# Patient Record
Sex: Female | Born: 1995 | Race: Black or African American | Hispanic: No | Marital: Single | Smoking: Former smoker
Health system: Southern US, Community
[De-identification: ages and names within clinical notes are randomized; demographics above are authoritative.]

## PROBLEM LIST (undated history)

## (undated) DIAGNOSIS — S060X9A Concussion with loss of consciousness of unspecified duration, initial encounter: Secondary | ICD-10-CM

## (undated) DIAGNOSIS — R55 Syncope and collapse: Secondary | ICD-10-CM

## (undated) DIAGNOSIS — J45909 Unspecified asthma, uncomplicated: Secondary | ICD-10-CM

## (undated) DIAGNOSIS — E079 Disorder of thyroid, unspecified: Secondary | ICD-10-CM

## (undated) DIAGNOSIS — S060XAA Concussion with loss of consciousness status unknown, initial encounter: Secondary | ICD-10-CM

## (undated) DIAGNOSIS — R51 Headache: Secondary | ICD-10-CM

## (undated) DIAGNOSIS — R519 Headache, unspecified: Secondary | ICD-10-CM

## (undated) DIAGNOSIS — G43909 Migraine, unspecified, not intractable, without status migrainosus: Secondary | ICD-10-CM

## (undated) HISTORY — DX: Syncope and collapse: R55

## (undated) HISTORY — DX: Concussion with loss of consciousness status unknown, initial encounter: S06.0XAA

## (undated) HISTORY — DX: Unspecified asthma, uncomplicated: J45.909

## (undated) HISTORY — DX: Migraine, unspecified, not intractable, without status migrainosus: G43.909

## (undated) HISTORY — DX: Headache: R51

## (undated) HISTORY — DX: Headache, unspecified: R51.9

## (undated) HISTORY — DX: Concussion with loss of consciousness of unspecified duration, initial encounter: S06.0X9A

---

## 2018-03-22 ENCOUNTER — Other Ambulatory Visit: Payer: Self-pay | Admitting: Surgery

## 2018-03-29 ENCOUNTER — Telehealth: Payer: Self-pay | Admitting: Hematology

## 2018-03-29 NOTE — Telephone Encounter (Signed)
Pt has been scheduled to see Dr. Candise Che on 5/6 at 11am. Pt aware to arrive 30 minutes early. Location given.

## 2018-03-30 ENCOUNTER — Telehealth: Payer: Self-pay | Admitting: Hematology

## 2018-03-30 NOTE — Telephone Encounter (Signed)
Patient called to reschedule and Md said its was ok to add for 5/10

## 2018-04-04 ENCOUNTER — Encounter: Payer: Self-pay | Admitting: Hematology

## 2018-04-07 NOTE — Progress Notes (Signed)
HEMATOLOGY/ONCOLOGY CONSULTATION NOTE  Date of Service: 04/08/2018  Patient Care Team: Patient, No Pcp Per as PCP - General (General Practice)  CHIEF COMPLAINTS/PURPOSE OF CONSULTATION:  Possible Von Willebrand Disease  HISTORY OF PRESENTING ILLNESS:    Rachel Farrell is a wonderful 22 y.o. female who has been referred to [place] by her surgeon Dr. Magnus Ivan for evaluation and management of possible inheritance of Von Willebrand Disease.   She has had abdominal pain on and off for 3 years. She was seen GI Dr. Loreta Ave who did an endoscopy and then [place]. She had another [place] which clearly indicated she had a small gallstone which was thought to be the cause of her symptoms. The pain has been wrenching or sharp pain. She notes when she eats fatty foods she gets hot flashes. She is to proceed with getting surgery to resolve this. She does take Prilosec but this has only helped her some. She notes she has loss weight given low appetite from pain.   She was being seen by Dr. Magnus Ivan for a scheduled for laparoscopic cholecystectomy, before her insurance is to be canceled, when the possibility of her having Von Willebrand Disease came up. Dr. Magnus Ivan referred her given she bleeds easily and her mother has Von Willebrand Disease. He would like to determine her clearance for surgery. She notes her insurance does not end until July for her.   She notes she has been diagnosed with anxiety and functional dyspepsia which she takes imipramine for. She also has significant acne that she is on low estrogen birth control for. This has helped her acne. She has been on birth control 21 day on, 1 week off pills for 2 years. She is currently on the last week of hormone pills.   She notes when she gets a cut she would bleed for longer times. Before birth control she had significant Menorrhagia and was anemic for 2 years. Her periods would be irregular. She is not sure how low her iron levels were. She did have to take oral  iron during that time.   She notes to having gum bleeding when brushing too hard. She notes she had all 4 wisdom teeth extracted 2 years ago. She does not remember excess bleeding at that time. She notes to having gum bleeding with flossing at the dentist. She denies history of epistaxis.  She notes she uses naproxen for her migraines. She is allergic to Penicillin. She is on cetirizine for her allergies.  She notes her thyroid levels were tested previously and were slightly off. She was told to get her levels checked more often.  According to the patient her mother is known to have VWD, she is not sure what type nor what treatment she had taken. She denies knowing other family members with bleeding issues. She notes maternal family has HTN and heart issues.     MEDICAL HISTORY:  Anxiety Possible Fhx of Von willebrand disease Functional Dyspepsia  SURGICAL HISTORY: -wisdom teeth extraction  SOCIAL HISTORY: Social History   Socioeconomic History  . Marital status: Single    Spouse name: Not on file  . Number of children: Not on file  . Years of education: Not on file  . Highest education level: Not on file  Occupational History  . Not on file  Social Needs  . Financial resource strain: Not on file  . Food insecurity:    Worry: Not on file    Inability: Not on file  . Transportation needs:  Medical: Not on file    Non-medical: Not on file  Tobacco Use  . Smoking status: Never Smoker  . Smokeless tobacco: Never Used  Substance and Sexual Activity  . Alcohol use: Not on file  . Drug use: Never  . Sexual activity: Not on file  Lifestyle  . Physical activity:    Days per week: Not on file    Minutes per session: Not on file  . Stress: Not on file  Relationships  . Social connections:    Talks on phone: Not on file    Gets together: Not on file    Attends religious service: Not on file    Active member of club or organization: Not on file    Attends meetings of clubs  or organizations: Not on file    Relationship status: Not on file  . Intimate partner violence:    Fear of current or ex partner: Not on file    Emotionally abused: Not on file    Physically abused: Not on file    Forced sexual activity: Not on file  Other Topics Concern  . Not on file  Social History Narrative  . Not on file    FAMILY HISTORY: Possible Fhx of Von Willebrand disease in her mother ALLERGIES:  is allergic to latex.  MEDICATIONS:  Current Outpatient Medications  Medication Sig Dispense Refill  . cetirizine (ZYRTEC) 10 MG tablet Take by mouth.    Marland Kitchen imipramine (TOFRANIL) 25 MG tablet Take by mouth.    . Norethindrone-Ethinyl Estradiol-Fe Biphas (LO LOESTRIN FE) 1 MG-10 MCG / 10 MCG tablet TAKE 1 TABLET, ORAL, DAILY    . omeprazole (PRILOSEC) 40 MG capsule   0   No current facility-administered medications for this visit.     REVIEW OF SYSTEMS:   10 Point review of Systems was done is negative except as noted above.  PHYSICAL EXAMINATION: ECOG PERFORMANCE STATUS: 0 - Asymptomatic  . Vitals:   04/08/18 1017  BP: 115/89  Pulse: 79  Resp: 17  Temp: 98.4 F (36.9 C)  SpO2: 100%   Filed Weights   04/08/18 1017  Weight: 134 lb 11.2 oz (61.1 kg)   .Body mass index is 23.12 kg/m.  GENERAL:alert, in no acute distress and comfortable SKIN: no acute rashes, no significant lesions EYES: conjunctiva are pink and non-injected, sclera anicteric OROPHARYNX: MMM, no exudates, no oropharyngeal erythema or ulceration NECK: supple, no JVD LYMPH:  no palpable lymphadenopathy in the cervical, axillary or inguinal regions LUNGS: clear to auscultation b/l with normal respiratory effort HEART: regular rate & rhythm ABDOMEN:  normoactive bowel sounds , non tender, not distended. Extremity: no pedal edema PSYCH: alert & oriented x 3 with fluent speech NEURO: no focal motor/sensory deficits  LABORATORY DATA:  I have reviewed the data as listed  .No flowsheet data  found.  .No flowsheet data found.   RADIOGRAPHIC STUDIES: I have personally reviewed the radiological images as listed and agreed with the findings in the report. No results found.  ASSESSMENT & PLAN:  Rachel Farrell is a 22 y.o. African-American female with   1. Possible Von Willebrand Disease  -Mother positive for VWD per patients report -history of Menorrhagia and intermittent gum bleeding  2. Gall Stones - symptomatic -She plans to have laparoscopic cholecystectomy as soon as possible before current insurance is canceled -Managed by Dr. Magnus Ivan   PLAN:  -I discussed what Von Willebrand Disease is and that it is likely to be present in each generation.  -  I explained what will happen with Von Willebrand Disease  when bleeding occurs. This can present differently based on the type of VWD and how severe it is.   -She would need to test for Von Willebrand Disease and differentiate which subtype she may have.  -I explained Von Willebrand factor levels are increased estrogen levels/replacement. Given she is on birth control her levels may be elevated upon testing. She is currently on the last week of hormone pills.  -I recommend getting tested the week before starting next cycle of pills if possible, as her levels will be closer to baseline.  -Given she lives 2 hours away, I will test today for clearance of surgery and I will discuss results with pt next week when results are in.  -Based on her levels she may or may not need treatment. If levels are low she could get treated with DDAVP or might need factor replacement.  -I recommend she do surgery in the hospital in case of concerns for bleeding and to be able to manage and potential bleeding issues.  -I strongly advised her to not take Naproxen or aspirin like medication for now. (we shall place NSAIDS in her medication allergies)   Labs today RTC with Dr Candise Che on 04/15/2018   All of the patients questions were answered with apparent  satisfaction. The patient knows to call the clinic with any problems, questions or concerns.  I spent 45 minutes counseling the patient face to face. The total time spent in the appointment was 60 minutes and more than 50% was on counseling and direct patient cares.    Wyvonnia Lora MD MS AAHIVMS [facility] Hematology/Oncology Physician [facility]  (Office):       [phone] (Work cell):  [phone] (Fax):           [phone]  04/08/2018 11:17 AM  This document serves as a record of services personally performed by Wyvonnia Lora, MD. It was created on his behalf by Delphina Cahill, a trained medical scribe. The creation of this record is based on the scribe's personal observations and the provider's statements to them.    .I have reviewed the above documentation for accuracy and completeness, and I agree with the above. Johney Maine MD MS

## 2018-04-08 ENCOUNTER — Telehealth: Payer: Self-pay

## 2018-04-08 ENCOUNTER — Inpatient Hospital Stay: Payer: BLUE CROSS/BLUE SHIELD | Attending: Hematology | Admitting: Hematology

## 2018-04-08 ENCOUNTER — Inpatient Hospital Stay: Payer: BLUE CROSS/BLUE SHIELD

## 2018-04-08 ENCOUNTER — Encounter: Payer: Self-pay | Admitting: Hematology

## 2018-04-08 VITALS — BP 115/89 | HR 79 | Temp 98.4°F | Resp 17 | Ht 64.0 in | Wt 134.7 lb

## 2018-04-08 DIAGNOSIS — R1013 Epigastric pain: Secondary | ICD-10-CM | POA: Diagnosis not present

## 2018-04-08 DIAGNOSIS — R109 Unspecified abdominal pain: Secondary | ICD-10-CM | POA: Diagnosis not present

## 2018-04-08 DIAGNOSIS — K8080 Other cholelithiasis without obstruction: Secondary | ICD-10-CM | POA: Diagnosis not present

## 2018-04-08 DIAGNOSIS — Z79899 Other long term (current) drug therapy: Secondary | ICD-10-CM | POA: Diagnosis not present

## 2018-04-08 DIAGNOSIS — D699 Hemorrhagic condition, unspecified: Secondary | ICD-10-CM

## 2018-04-08 DIAGNOSIS — Z01818 Encounter for other preprocedural examination: Secondary | ICD-10-CM

## 2018-04-08 DIAGNOSIS — F419 Anxiety disorder, unspecified: Secondary | ICD-10-CM | POA: Diagnosis not present

## 2018-04-08 DIAGNOSIS — E079 Disorder of thyroid, unspecified: Secondary | ICD-10-CM | POA: Insufficient documentation

## 2018-04-08 DIAGNOSIS — D6800 Von Willebrand disease, unspecified: Secondary | ICD-10-CM

## 2018-04-08 LAB — CBC WITH DIFFERENTIAL/PLATELET
BASOS ABS: 0 10*3/uL (ref 0.0–0.1)
BASOS PCT: 0 %
EOS PCT: 1 %
Eosinophils Absolute: 0 10*3/uL (ref 0.0–0.5)
HCT: 42.2 % (ref 34.8–46.6)
Hemoglobin: 14 g/dL (ref 11.6–15.9)
LYMPHS PCT: 32 %
Lymphs Abs: 1.3 10*3/uL (ref 0.9–3.3)
MCH: 29.6 pg (ref 25.1–34.0)
MCHC: 33.2 g/dL (ref 31.5–36.0)
MCV: 89.2 fL (ref 79.5–101.0)
MONO ABS: 0.3 10*3/uL (ref 0.1–0.9)
Monocytes Relative: 7 %
NEUTROS ABS: 2.4 10*3/uL (ref 1.5–6.5)
Neutrophils Relative %: 60 %
Platelets: 305 10*3/uL (ref 145–400)
RBC: 4.73 MIL/uL (ref 3.70–5.45)
RDW: 12.5 % (ref 11.2–14.5)
WBC: 4 10*3/uL (ref 3.9–10.3)

## 2018-04-08 LAB — CMP (CANCER CENTER ONLY)
ALBUMIN: 4.3 g/dL (ref 3.5–5.0)
ALK PHOS: 69 U/L (ref 40–150)
ALT: 16 U/L (ref 0–55)
AST: 18 U/L (ref 5–34)
Anion gap: 7 (ref 3–11)
BILIRUBIN TOTAL: 0.5 mg/dL (ref 0.2–1.2)
BUN: 7 mg/dL (ref 7–26)
CALCIUM: 9.8 mg/dL (ref 8.4–10.4)
CO2: 23 mmol/L (ref 22–29)
Chloride: 107 mmol/L (ref 98–109)
Creatinine: 0.95 mg/dL (ref 0.60–1.10)
GFR, Est AFR Am: >60 mL/min (ref >60)
GLUCOSE: 81 mg/dL (ref 70–140)
Potassium: 4.2 mmol/L (ref 3.5–5.1)
Sodium: 137 mmol/L (ref 136–145)
TOTAL PROTEIN: 8.2 g/dL (ref 6.4–8.3)

## 2018-04-08 LAB — PLATELET FUNCTION ASSAY: Collagen / Epinephrine: 142 seconds (ref 0–193)

## 2018-04-08 LAB — PROTIME-INR
INR: 1
PROTHROMBIN TIME: 13.1 s (ref 11.4–15.2)

## 2018-04-08 LAB — APTT: APTT: 29 s (ref 24–36)

## 2018-04-08 NOTE — Telephone Encounter (Signed)
Printed avs and calender of upcoming appointment. Per 5/10 los 

## 2018-04-08 NOTE — Patient Instructions (Signed)
Thank you for choosing [facility] to provide your oncology and hematology care.  To afford each patient quality time with our providers, please arrive 30 minutes before your scheduled appointment time.  If you arrive late for your appointment, you may be asked to reschedule.  We strive to give you quality time with our providers, and arriving late affects you and other patients whose appointments are after yours.   If you are a no show for multiple scheduled visits, you may be dismissed from the clinic at the providers discretion.    Again, thank you for choosing [facility], our hope is that these requests will decrease the amount of time that you wait before being seen by our physicians.  ______________________________________________________________________  Should you have questions after your visit to the [facility], please contact our office at [phone] between the hours of 8:30 and 4:30 p.m.    Voicemails left after 4:30p.m will not be returned until the following business day.    For prescription refill requests, please have your pharmacy contact us directly.  Please also try to allow 48 hours for prescription requests.    Please contact the scheduling department for questions regarding scheduling.  For scheduling of procedures such as PET scans, CT scans, MRI, Ultrasound, etc please contact central scheduling at [phone].    Resources For Cancer Patients and Caregivers:   Oncolink.org:  A wonderful resource for patients and healthcare providers for information regarding your disease, ways to tract your treatment, what to expect, etc.     American Cancer Society:  800-227-2345  Can help patients locate various types of support and financial assistance  Cancer Care: 1-800-813-HOPE (4673) Provides financial assistance, online support groups, medication/co-pay assistance.    [place] DSS:  336-641-3447 Where to apply for food  stamps, Medicaid, and utility assistance  Medicare Rights Center: 800-333-4114 Helps people with Medicare understand their rights and benefits, navigate the Medicare system, and secure the quality healthcare they deserve  SCAT: 336-333-6589 [place] [employer]'s shared-ride transportation service for eligible riders who have a disability that prevents them from riding the fixed route bus.    For additional information on assistance programs please contact our social worker:   Grier Hock/Abigail Elmore:  [phone]            

## 2018-04-11 LAB — COAG STUDIES INTERP REPORT

## 2018-04-11 LAB — VON WILLEBRAND PANEL
Coagulation Factor VIII: 70 % (ref 57–163)
Ristocetin Co-factor, Plasma: 62 % (ref 50–200)
Von Willebrand Antigen, Plasma: 83 % (ref 50–200)

## 2018-04-12 ENCOUNTER — Ambulatory Visit: Payer: BLUE CROSS/BLUE SHIELD | Admitting: Neurology

## 2018-04-15 ENCOUNTER — Inpatient Hospital Stay: Payer: BLUE CROSS/BLUE SHIELD

## 2018-04-15 ENCOUNTER — Encounter: Payer: Self-pay | Admitting: Hematology

## 2018-04-15 ENCOUNTER — Ambulatory Visit: Payer: BLUE CROSS/BLUE SHIELD

## 2018-04-15 ENCOUNTER — Inpatient Hospital Stay (HOSPITAL_BASED_OUTPATIENT_CLINIC_OR_DEPARTMENT_OTHER): Payer: BLUE CROSS/BLUE SHIELD | Admitting: Hematology

## 2018-04-15 VITALS — BP 130/96 | HR 75 | Temp 97.9°F | Resp 18 | Ht 64.0 in | Wt 130.9 lb

## 2018-04-15 DIAGNOSIS — D699 Hemorrhagic condition, unspecified: Secondary | ICD-10-CM

## 2018-04-15 DIAGNOSIS — F419 Anxiety disorder, unspecified: Secondary | ICD-10-CM | POA: Diagnosis not present

## 2018-04-15 DIAGNOSIS — D6800 Von Willebrand disease, unspecified: Secondary | ICD-10-CM

## 2018-04-15 DIAGNOSIS — Z01818 Encounter for other preprocedural examination: Secondary | ICD-10-CM

## 2018-04-15 DIAGNOSIS — R1013 Epigastric pain: Secondary | ICD-10-CM | POA: Diagnosis not present

## 2018-04-15 DIAGNOSIS — K8080 Other cholelithiasis without obstruction: Secondary | ICD-10-CM

## 2018-04-15 DIAGNOSIS — Z79899 Other long term (current) drug therapy: Secondary | ICD-10-CM | POA: Diagnosis not present

## 2018-04-15 DIAGNOSIS — R109 Unspecified abdominal pain: Secondary | ICD-10-CM | POA: Diagnosis not present

## 2018-04-15 DIAGNOSIS — E079 Disorder of thyroid, unspecified: Secondary | ICD-10-CM | POA: Diagnosis not present

## 2018-04-15 LAB — VON WILLEBRAND FACTOR MULTIMER

## 2018-04-15 NOTE — Progress Notes (Signed)
HEMATOLOGY/ONCOLOGY CONSULTATION NOTE  Date of Service: 04/15/2018  Patient Care Team: Patient, No Pcp Per as PCP - General (General Practice)  CHIEF COMPLAINTS/PURPOSE OF CONSULTATION:  Possible Von Willebrand Disease  HISTORY OF PRESENTING ILLNESS:    Rachel Farrell is a wonderful 22 y.o. female who has been referred to [place] by her surgeon Dr. Magnus Ivan for evaluation and management of possible inheritance of Von Willebrand Disease.   She has had abdominal pain on and off for 3 years. She was seen GI Dr. Loreta Ave who did an endoscopy and then [place]. She had another [place] which clearly indicated she had a small gallstone which was thought to be the cause of her symptoms. The pain has been wrenching or sharp pain. She notes when she eats fatty foods she gets hot flashes. She is to proceed with getting surgery to resolve this. She does take Prilosec but this has only helped her some. She notes she has loss weight given low appetite from pain.   She was being seen by Dr. Magnus Ivan for a scheduled for laparoscopic cholecystectomy, before her insurance is to be canceled, when the possibility of her having Von Willebrand Disease came up. Dr. Magnus Ivan referred her given she bleeds easily and her mother has Von Willebrand Disease. He would like to determine her clearance for surgery. She notes her insurance does not end until July for her.   She notes she has been diagnosed with anxiety and functional dyspepsia which she takes imipramine for. She also has significant acne that she is on low estrogen birth control for. This has helped her acne. She has been on birth control 21 day on, 1 week off pills for 2 years. She is currently on the last week of hormone pills.   She notes when she gets a cut she would bleed for longer times. Before birth control she had significant Menorrhagia and was anemic for 2 years. Her periods would be irregular. She is not sure how low her iron levels were. She did have to take oral  iron during that time.   She notes to having gum bleeding when brushing too hard. She notes she had all 4 wisdom teeth extracted 2 years ago. She does not remember excess bleeding at that time. She notes to having gum bleeding with flossing at the dentist. She denies history of epistaxis.  She notes she uses naproxen for her migraines. She is allergic to Penicillin. She is on cetirizine for her allergies.  She notes her thyroid levels were tested previously and were slightly off. She was told to get her levels checked more often.  According to the patient her mother is known to have VWD, she is not sure what type nor what treatment she had taken. She denies knowing other family members with bleeding issues. She notes maternal family has HTN and heart issues.    INTERVAL HISTORY: Rachel Farrell presents to the office today for follow up of her ?Von willebrand disease. She presents by herself. She is doing well overall.    She is now on her week off of her estrogen hormones. She notes that her mother had a diagnosis of von willebrand disease. She had wisdom teeth extraction with minimal bleeding. She notes that she has extreme nausea with eating and that she has had several gallbladder "attacks" within the past year, with one "attack" lasting 1 week. She has abdominal pain localized to her RUQ last night when she ate chicken nuggets.   Previous labs  from (04/08/18) of CBC, CMP, Coag studies, Von williebrand panel is as follows: all values are WNL.  We discussed her coag labs in details and noted that based on her current labs we cannot make a diagnosis of Von Willebrand disease. It is possible that she cold have very mild type 1 VWD but factor levels are in the normal range due to estrogen containing OCP's (estrogen is known to increased Von Willebrand factor levels.  On review of systems, she denies any other symptoms. Pertinent positives are listed and detailed within the above HPI.   MEDICAL  HISTORY:  Anxiety Possible Fhx of Von willebrand disease Functional Dyspepsia  SURGICAL HISTORY: -wisdom teeth extraction  SOCIAL HISTORY: Social History   Socioeconomic History  . Marital status: Single    Spouse name: Not on file  . Number of children: Not on file  . Years of education: Not on file  . Highest education level: Not on file  Occupational History  . Not on file  Social Needs  . Financial resource strain: Not on file  . Food insecurity:    Worry: Not on file    Inability: Not on file  . Transportation needs:    Medical: Not on file    Non-medical: Not on file  Tobacco Use  . Smoking status: Never Smoker  . Smokeless tobacco: Never Used  Substance and Sexual Activity  . Alcohol use: Not on file  . Drug use: Never  . Sexual activity: Not on file  Lifestyle  . Physical activity:    Days per week: Not on file    Minutes per session: Not on file  . Stress: Not on file  Relationships  . Social connections:    Talks on phone: Not on file    Gets together: Not on file    Attends religious service: Not on file    Active member of club or organization: Not on file    Attends meetings of clubs or organizations: Not on file    Relationship status: Not on file  . Intimate partner violence:    Fear of current or ex partner: Not on file    Emotionally abused: Not on file    Physically abused: Not on file    Forced sexual activity: Not on file  Other Topics Concern  . Not on file  Social History Narrative  . Not on file    FAMILY HISTORY: Possible Fhx of Von Willebrand disease in her mother ALLERGIES:  is allergic to latex.  MEDICATIONS:  Current Outpatient Medications  Medication Sig Dispense Refill  . cetirizine (ZYRTEC) 10 MG tablet Take by mouth.    Marland Kitchen imipramine (TOFRANIL) 25 MG tablet Take by mouth.    . Norethindrone-Ethinyl Estradiol-Fe Biphas (LO LOESTRIN FE) 1 MG-10 MCG / 10 MCG tablet TAKE 1 TABLET, ORAL, DAILY    . omeprazole (PRILOSEC) 40  MG capsule   0   No current facility-administered medications for this visit.     REVIEW OF SYSTEMS:   A 10+ POINT REVIEW OF SYSTEMS WAS OBTAINED including neurology, dermatology, psychiatry, cardiac, respiratory, lymph, extremities, GI, GU, Musculoskeletal, constitutional, breasts, reproductive, HEENT.  All pertinent positives are noted in the HPI.  All others are negative.   PHYSICAL EXAMINATION: ECOG PERFORMANCE STATUS: 0 - Asymptomatic  . Vitals:   04/15/18 1009  BP: (!) 130/96  Pulse: 75  Resp: 18  Temp: 97.9 F (36.6 C)  SpO2: 100%   Filed Weights   04/15/18 1009  Weight:  130 lb 14.4 oz (59.4 kg)   .Body mass index is 22.47 kg/m.  GENERAL:alert, in no acute distress and comfortable SKIN: no acute rashes, no significant lesions EYES: conjunctiva are Farrell and non-injected, sclera anicteric OROPHARYNX: MMM, no exudates, no oropharyngeal erythema or ulceration NECK: supple, no JVD LYMPH:  no palpable lymphadenopathy in the cervical, axillary or inguinal regions LUNGS: clear to auscultation b/l with normal respiratory effort HEART: regular rate & rhythm ABDOMEN:  normoactive bowel sounds , non tender, not distended. Extremity: no pedal edema PSYCH: alert & oriented x 3 with fluent speech NEURO: no focal motor/sensory deficits   LABORATORY DATA:  I have reviewed the data as listed  . CBC Latest Ref Rng & Units 04/08/2018  WBC 3.9 - 10.3 K/uL 4.0  Hemoglobin 11.6 - 15.9 g/dL 16.1  Hematocrit 09.6 - 46.6 % 42.2  Platelets 145 - 400 K/uL 305    . CMP Latest Ref Rng & Units 04/08/2018  Glucose 70 - 140 mg/dL 81  BUN 7 - 26 mg/dL 7  Creatinine 0.45 - 4.09 mg/dL 8.11  Sodium 914 - 782 mmol/L 137  Potassium 3.5 - 5.1 mmol/L 4.2  Chloride 98 - 109 mmol/L 107  CO2 22 - 29 mmol/L 23  Calcium 8.4 - 10.4 mg/dL 9.8  Total Protein 6.4 - 8.3 g/dL 8.2  Total Bilirubin 0.2 - 1.2 mg/dL 0.5  Alkaline Phos 40 - 150 U/L 69  AST 5 - 34 U/L 18  ALT 0 - 55 U/L 16     Component     Latest Ref Rng & Units 04/08/2018 04/15/2018  Coagulation Factor VIII     57 - 163 % 70 74  Ristocetin Co-factor, Plasma     50 - 200 % 62 59  Von Willebrand Antigen, Plasma     50 - 200 % 83 81  PFA Interpretation         Collagen / Epinephrine     0 - 193 seconds 142   Von Willebrand Multimers      Comment    Component     Latest Ref Rng & Units 04/08/2018  Prothrombin Time     11.4 - 15.2 seconds 13.1  INR      1.00  APTT     24 - 36 seconds 29   Von Willebrand Multimeric      No reference range information available      Resulting Lab: [facility]      Comments:           (NOTE)           VWF multimer analysis shows a normal pattern and            distribution of           Bands.   RADIOGRAPHIC STUDIES: I have personally reviewed the radiological images as listed and agreed with the findings in the report. No results found.  ASSESSMENT & PLAN:  Rachel Farrell is a 22 y.o. African-American female with   1. Possible Von Willebrand Disease  -Mother possibly positive for VWD per patients report -history of Menorrhagia and intermittent gum bleeding  2. Gall Stones - symptomatic -She plans to have laparoscopic cholecystectomy as soon as possible before current insurance is canceled -Managed by Dr. Magnus Ivan   PLAN:  -we discussed that all her coagulation testing including PT, aPTT, Platelet function and Von Willebrand panel testing are WNL and are not diagnostic of Von Willebrand disease.change in the  VWP levels ofFVIII, VWF and VWF activity levels. -no clear indication of ddAVP/ factor replacement empirically. -patient is appropriate to proceed with planned Lap Chole for symptomatic cholelithiasis. -given doubt regarding masked mild type 1 VWD there could be an increased bleeding risk peri-operatively and so would recommend the surgery be done in the hospital setting where there would be access to intermediate purity factor VIII  containing VWF if necessary to management the possibility of peri-operative bleeding. -I still recommend that the patient doesn't use aspirin like medications or NSAIDS per-operatively to further reduce the risk of bleeding..  -At this time, the patient is able to schedule her gallbladder surgery with Dr. Magnus Ivan.    Lab today RTC with Dr Candise Che as needed    All of the patients questions were answered with apparent satisfaction. The patient knows to call the clinic with any problems, questions or concerns.  I spent 25 minutes counseling the patient face to face. The total time spent in the appointment was 30 minutes and more than 50% was on counseling and direct patient cares.    Wyvonnia Lora MD MS AAHIVMS [facility] Hematology/Oncology Physician [facility]  (Office):       [phone] (Work cell):  [phone] (Fax):           [phone]  04/15/2018 9:24 AM  This document serves as a record of services personally performed by Wyvonnia Lora, MD. It was created on his behalf by Chestine Spore, a trained medical scribe. The creation of this record is based on the scribe's personal observations and the provider's statements to them.   .I have reviewed the above documentation for accuracy and completeness, and I agree with the above. Johney Maine MD MS

## 2018-04-18 LAB — VON WILLEBRAND PANEL
COAGULATION FACTOR VIII: 74 % (ref 57–163)
Ristocetin Co-factor, Plasma: 59 % (ref 50–200)
VON WILLEBRAND ANTIGEN, PLASMA: 81 % (ref 50–200)

## 2018-04-18 LAB — COAG STUDIES INTERP REPORT

## 2018-04-19 ENCOUNTER — Telehealth: Payer: Self-pay | Admitting: *Deleted

## 2018-04-19 ENCOUNTER — Telehealth: Payer: Self-pay | Admitting: Hematology

## 2018-04-19 NOTE — Telephone Encounter (Signed)
Pt called and requested "more information" be sent to patient for surgical clearance to Dr. Magnus Ivan due to recent referral for VW.  Patient could not provide Dr. Eliberto Ivory fax number.  Advised patient that if Dr. Magnus Ivan has form for surgical clearance that it would need to be faxed to [place].  Fax number provided.  This RN contacted Dr. Eliberto Ivory office for fax number.  Office note and recent lab work faxed to provided number [phone], confirmation received.

## 2018-04-19 NOTE — Telephone Encounter (Signed)
Faxed ROI to [facility] on 04/19/18, Release ID [id number]

## 2018-04-27 ENCOUNTER — Telehealth: Payer: Self-pay

## 2018-04-27 NOTE — Telephone Encounter (Signed)
Received signed consent for release of medical information from [facility] at [address] [place], [place] [address]. Recent labs and doctor note faxed to [phone]. Confirmed fax receipt 04/27/18.

## 2018-04-30 HISTORY — PX: CHOLECYSTECTOMY: SHX55

## 2018-08-29 ENCOUNTER — Telehealth: Payer: Self-pay | Admitting: *Deleted

## 2018-08-29 NOTE — Telephone Encounter (Signed)
Medical Records faxed to [facility]; release [id number]

## 2018-09-02 ENCOUNTER — Other Ambulatory Visit: Payer: Self-pay

## 2018-09-02 ENCOUNTER — Encounter (HOSPITAL_COMMUNITY): Payer: Self-pay | Admitting: Emergency Medicine

## 2018-09-02 ENCOUNTER — Ambulatory Visit (HOSPITAL_COMMUNITY)
Admission: EM | Admit: 2018-09-02 | Discharge: 2018-09-02 | Disposition: A | Discharge status: Home / Self Care | Payer: BLUE CROSS/BLUE SHIELD | Attending: Family Medicine | Admitting: Family Medicine

## 2018-09-02 DIAGNOSIS — J683 Other acute and subacute respiratory conditions due to chemicals, gases, fumes and vapors: Secondary | ICD-10-CM

## 2018-09-02 HISTORY — DX: Disorder of thyroid, unspecified: E07.9

## 2018-09-02 MED ORDER — MONTELUKAST SODIUM 10 MG PO TABS: 10.0000 mg | ORAL_TABLET | Freq: Every day | ORAL | 0 refills | Status: AC

## 2018-09-02 NOTE — Discharge Instructions (Addendum)
I am expecting improvement over the next week.  If you are not improving or if the symptoms are getting worse, please return.

## 2018-09-02 NOTE — ED Provider Notes (Signed)
[facility]    CSN: [id number] Arrival date & time: 09/02/18  1320     History   Chief Complaint Chief Complaint  Patient presents with  . Shortness of Breath    HPI Rachel Farrell is a 22 y.o. female.   Pt reports SOB with exertion for the last month.  She reports pain that she thinks is in the bottom of her lungs that radiates to her back.  She also reports recent chest congestion.  Patient is a non-smoker and has no history of asthma.  She does have a brother with asthma.  Patient states that she has some occasional sharp pains in her lower ribs that are quite temporary.  Patient was given Naprosyn at her student [facility] center but this had no effect on her chest pain.  Patient states that she notices shortness of breath when she is power walking to class or when she is performing dance.  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     Past Medical History:  Diagnosis Date  . Thyroid disease     There are no active problems to display for this patient.   Past Surgical History:  Procedure Laterality Date  . CHOLECYSTECTOMY      OB History   None      Home Medications    Prior to Admission medications   Medication Sig Start Date End Date Taking? Authorizing Provider  cetirizine (ZYRTEC) 10 MG tablet Take by mouth.   Yes [provider]  Norethindrone-Ethinyl Estradiol-Fe Biphas (LO LOESTRIN FE) 1 MG-10 MCG / 10 MCG tablet TAKE 1 TABLET, ORAL, DAILY 03/10/16  Yes [provider]  imipramine (TOFRANIL) 25 MG tablet Take by mouth. 05/06/17 05/06/18  [provider]  montelukast (SINGULAIR) 10 MG tablet Take 1 tablet (10 mg total) by mouth at bedtime. 09/02/18   Elvina Sidle, MD    Family History History reviewed. No pertinent family history.  Social History Social History   Tobacco Use  . Smoking status: Never Smoker  . Smokeless tobacco: Never Used  Substance Use Topics  . Alcohol use: Never   Frequency: Never  . Drug use: Never     Allergies   Penicillins and Latex   Review of Systems Review of Systems  Constitutional: Positive for diaphoresis. Negative for chills, fatigue and fever.  HENT: Positive for congestion.   Respiratory: Positive for cough and shortness of breath. Negative for apnea, choking, chest tightness, wheezing and stridor.   Cardiovascular: Positive for chest pain. Negative for palpitations and leg swelling.  Gastrointestinal: Negative.   Musculoskeletal: Negative.   Neurological: Negative.   All other systems reviewed and are negative.    Physical Exam Triage Vital Signs ED Triage Vitals [09/02/18 1406]  Enc Vitals Group     BP (!) 123/92     Pulse Rate 73     Resp      Temp 98.7 F (37.1 C)     Temp Source Oral     SpO2 100 %     Weight      Height      Head Circumference      Peak Flow      Pain Score 0     Pain Loc      Pain Edu?      Excl. in GC?    No data found.  Updated Vital Signs BP (!) 123/92 (BP Location: Left Arm)   Pulse 73   Temp 98.7 F (  37.1 C) (Oral)   LMP 06/02/2018 (Approximate)   SpO2 100%    Physical Exam  Constitutional: She appears well-developed and well-nourished.  HENT:  Head: Normocephalic and atraumatic.  Mouth/Throat: Oropharynx is clear and moist.  Eyes: Pupils are equal, round, and reactive to light. EOM are normal.  Neck: Normal range of motion. Neck supple.  Cardiovascular: Normal rate, regular rhythm and normal heart sounds.  Pulmonary/Chest: Effort normal and breath sounds normal.  Musculoskeletal: Normal range of motion.       Right lower leg: Normal.       Left lower leg: Normal.  Neurological: She is alert.  Skin: Skin is warm and dry. Capillary refill takes less than 2 seconds.  Psychiatric: She has a normal mood and affect.  Nursing note and vitals reviewed.    UC Treatments / Results  Labs (all labs ordered are listed, but only abnormal results are displayed) Labs Reviewed  - No data to display  EKG None  Radiology No results found.  Procedures Procedures (including critical care time)  Medications Ordered in UC Medications - No data to display  Initial Impression / Assessment and Plan / UC Course  I have reviewed the triage vital signs and the nursing notes.  Pertinent labs & imaging results that were available during my care of the patient were reviewed by me and considered in my medical decision making (see chart for details).     Final Clinical Impressions(s) / UC Diagnoses   Final diagnoses:  Reactive airways dysfunction syndrome [facility])     Discharge Instructions     I am expecting improvement over the next week.  If you are not improving or if the symptoms are getting worse, please return.    ED Prescriptions    Medication Sig Dispense Auth. Provider   montelukast (SINGULAIR) 10 MG tablet Take 1 tablet (10 mg total) by mouth at bedtime. 30 tablet Elvina Sidle, MD     Controlled Substance Prescriptions [place] Controlled Substance Registry consulted? Not Applicable   Elvina Sidle, MD 09/02/18 1425

## 2018-09-02 NOTE — ED Triage Notes (Signed)
Pt reports SOB with exertion for the last month.  She reports pain that she thinks is in the bottom of her lungs that radiates to her back.  She also reports recent chest congestion.

## 2018-11-06 ENCOUNTER — Other Ambulatory Visit: Payer: Self-pay

## 2018-11-06 ENCOUNTER — Encounter (HOSPITAL_COMMUNITY): Payer: Self-pay | Admitting: Emergency Medicine

## 2018-11-06 ENCOUNTER — Emergency Department (HOSPITAL_COMMUNITY): Payer: BLUE CROSS/BLUE SHIELD

## 2018-11-06 ENCOUNTER — Emergency Department (HOSPITAL_COMMUNITY)
Admission: EM | Admit: 2018-11-06 | Discharge: 2018-11-06 | Disposition: A | Discharge status: Home / Self Care | Payer: BLUE CROSS/BLUE SHIELD | Attending: Emergency Medicine | Admitting: Emergency Medicine

## 2018-11-06 DIAGNOSIS — R0600 Dyspnea, unspecified: Secondary | ICD-10-CM

## 2018-11-06 DIAGNOSIS — R0602 Shortness of breath: Secondary | ICD-10-CM | POA: Diagnosis present

## 2018-11-06 DIAGNOSIS — R002 Palpitations: Secondary | ICD-10-CM | POA: Insufficient documentation

## 2018-11-06 DIAGNOSIS — Z79899 Other long term (current) drug therapy: Secondary | ICD-10-CM | POA: Diagnosis not present

## 2018-11-06 DIAGNOSIS — E079 Disorder of thyroid, unspecified: Secondary | ICD-10-CM | POA: Diagnosis not present

## 2018-11-06 LAB — COMPREHENSIVE METABOLIC PANEL
ALT: 21 U/L (ref 0–44)
AST: 22 U/L (ref 15–41)
Albumin: 4.7 g/dL (ref 3.5–5.0)
Alkaline Phosphatase: 58 U/L (ref 38–126)
Anion gap: 10 (ref 5–15)
BILIRUBIN TOTAL: 0.7 mg/dL (ref 0.3–1.2)
BUN: 14 mg/dL (ref 6–20)
CO2: 22 mmol/L (ref 22–32)
Calcium: 9.7 mg/dL (ref 8.9–10.3)
Chloride: 106 mmol/L (ref 98–111)
Creatinine, Ser: 0.91 mg/dL (ref 0.44–1.00)
GFR calc Af Amer: >60 mL/min (ref >60)
GFR calc non Af Amer: >60 mL/min (ref >60)
Glucose, Bld: 102 mg/dL — ABNORMAL HIGH (ref 70–99)
Potassium: 3.8 mmol/L (ref 3.5–5.1)
Sodium: 138 mmol/L (ref 135–145)
TOTAL PROTEIN: 8.4 g/dL — AB (ref 6.5–8.1)

## 2018-11-06 LAB — CBC WITH DIFFERENTIAL/PLATELET
Abs Immature Granulocytes: 0.02 10*3/uL (ref 0.00–0.07)
Basophils Absolute: 0 10*3/uL (ref 0.0–0.1)
Basophils Relative: 0 %
Eosinophils Absolute: 0 10*3/uL (ref 0.0–0.5)
Eosinophils Relative: 0 %
HCT: 45.2 % (ref 36.0–46.0)
Hemoglobin: 14.7 g/dL (ref 12.0–15.0)
Immature Granulocytes: 0 %
Lymphocytes Relative: 10 %
Lymphs Abs: 1 10*3/uL (ref 0.7–4.0)
MCH: 29.6 pg (ref 26.0–34.0)
MCHC: 32.5 g/dL (ref 30.0–36.0)
MCV: 90.9 fL (ref 80.0–100.0)
Monocytes Absolute: 0.5 10*3/uL (ref 0.1–1.0)
Monocytes Relative: 5 %
Neutro Abs: 8 10*3/uL — ABNORMAL HIGH (ref 1.7–7.7)
Neutrophils Relative %: 85 %
PLATELETS: 379 10*3/uL (ref 150–400)
RBC: 4.97 MIL/uL (ref 3.87–5.11)
RDW: 12.5 % (ref 11.5–15.5)
WBC: 9.5 10*3/uL (ref 4.0–10.5)
nRBC: 0 % (ref 0.0–0.2)

## 2018-11-06 LAB — URINALYSIS, ROUTINE W REFLEX MICROSCOPIC
Bacteria, UA: NONE SEEN
Bilirubin Urine: NEGATIVE
Glucose, UA: NEGATIVE mg/dL
Hgb urine dipstick: NEGATIVE
Ketones, ur: 5 mg/dL — AB
Leukocytes, UA: NEGATIVE
Nitrite: NEGATIVE
Protein, ur: 100 mg/dL — AB
Specific Gravity, Urine: 1.031 — ABNORMAL HIGH (ref 1.005–1.030)
pH: 5 (ref 5.0–8.0)

## 2018-11-06 LAB — RAPID URINE DRUG SCREEN, HOSP PERFORMED
Amphetamines: NOT DETECTED
Barbiturates: NOT DETECTED
Benzodiazepines: NOT DETECTED
Cocaine: NOT DETECTED
Opiates: NOT DETECTED
Tetrahydrocannabinol: POSITIVE — AB

## 2018-11-06 LAB — POCT I-STAT TROPONIN I: Troponin i, poc: 0 ng/mL (ref 0.00–0.08)

## 2018-11-06 LAB — I-STAT BETA HCG BLOOD, ED (NOT ORDERABLE): I-stat hCG, quantitative: <5 m[IU]/mL (ref <5)

## 2018-11-06 LAB — LIPASE, BLOOD: Lipase: 30 U/L (ref 11–51)

## 2018-11-06 LAB — TSH: TSH: 0.365 u[IU]/mL (ref 0.350–4.500)

## 2018-11-06 LAB — D-DIMER, QUANTITATIVE: D-Dimer, Quant: 0.38 ug/mL-FEU (ref 0.00–0.50)

## 2018-11-06 MED ORDER — HYDROXYZINE HCL 25 MG PO TABS: 25.0000 mg | ORAL_TABLET | Freq: Four times a day (QID) | ORAL | 0 refills | Status: DC | PRN

## 2018-11-06 MED ORDER — ALBUTEROL SULFATE (2.5 MG/3ML) 0.083% IN NEBU: 5.0000 mg | INHALATION_SOLUTION | Freq: Once | RESPIRATORY_TRACT | Status: DC

## 2018-11-06 NOTE — ED Triage Notes (Signed)
Patient reports SOB and cough x2 months. Reports relief with use of inhaler. Speaking in full sentences. In NAD. Denies increase in SOB on exertion.

## 2018-11-06 NOTE — ED Provider Notes (Signed)
[facility]ENCY [facility]PT Provider Note   CSN: [id number] Arrival date & time: 11/06/18  1923     History   Chief Complaint Chief Complaint  Patient presents with  . Shortness of Breath    HPI Rachel Farrell is a 22 y.o. female.  HPI Patient reports that she got racing heart and shortness of breath tonight.  This has been a problem on and off for 2 months.  She reports that she uses an inhaler that has been prescribed to her but sometimes that just makes her feel more jittery and like her heart is racing.  She reports she also was feeling some sharp pains shooting in the front of her chest tonight.  She has not had fever or chills.  She has not had productive cough recently.  Outside of symptoms she does recall there was more cough related symptoms.  Patient does smoke marijuana.  She reports that she had smoked some tonight and within about 15 minutes of smoking she did start to feel like she was short of breath and used her rescue inhaler then her heart was racing and she felt like she might pass out.  Reports she is due to see an endocrinologist because her T4 has been elevated.  She thinks that might be a contributing factor as well.  She is not having lower extremity swelling or pain.  She does take birth control pills.  Her mother reportedly has history of DVT. Past Medical History:  Diagnosis Date  . Thyroid disease     There are no active problems to display for this patient.   Past Surgical History:  Procedure Laterality Date  . CHOLECYSTECTOMY       OB History   None      Home Medications    Prior to Admission medications   Medication Sig Start Date End Date Taking? Authorizing Provider  FLOVENT HFA 110 MCG/ACT inhaler Inhale 1 puff into the lungs daily. 09/23/18  Yes [provider]  montelukast (SINGULAIR) 10 MG tablet Take 1 tablet (10 mg total) by mouth at bedtime. 09/02/18  Yes Elvina Sidle, MD  Norethindrone-Ethinyl  Estradiol-Fe Biphas (LO LOESTRIN FE) 1 MG-10 MCG / 10 MCG tablet Take 1 tablet by mouth daily.  03/10/16  Yes [provider]  PROAIR HFA 108 (90 Base) MCG/ACT inhaler Inhale 1-2 puffs into the lungs every 6 (six) hours as needed for wheezing or shortness of breath.  09/23/18  Yes [provider]  hydrOXYzine (ATARAX/VISTARIL) 25 MG tablet Take 1 tablet (25 mg total) by mouth every 6 (six) hours as needed. 11/06/18   Arby Barrette, MD    Family History No family history on file.  Social History Social History   Tobacco Use  . Smoking status: Never Smoker  . Smokeless tobacco: Never Used  Substance Use Topics  . Alcohol use: Never    Frequency: Never  . Drug use: Never     Allergies   Molds & smuts; Penicillins; Honey; and Latex   Review of Systems Review of Systems 10 Systems reviewed and are negative for acute change except as noted in the HPI.  Physical Exam Updated Vital Signs BP (!) 132/91   Pulse 100   Temp 97.8 F (36.6 C) (Oral)   Resp 17   Ht 5\' 3"  (1.6 m)   Wt 59 kg   LMP 09/30/2018   SpO2 100%   BMI 23.03 kg/m   Physical Exam  Constitutional: She is oriented to person,  place, and time. She appears well-developed and well-nourished. No distress.  HENT:  Head: Normocephalic and atraumatic.  Mouth/Throat: Oropharynx is clear and moist.  Eyes: Pupils are equal, round, and reactive to light. Conjunctivae and EOM are normal.  Neck: Neck supple. No thyromegaly present.  Cardiovascular:  Borderline tachycardia.  No rub murmur gallop.  Distal pulses intact.  Pulmonary/Chest: Effort normal and breath sounds normal.  Abdominal: Soft. She exhibits no distension. There is no tenderness. There is no guarding.  Musculoskeletal: Normal range of motion. She exhibits no edema or tenderness.  Lymphadenopathy:    She has no cervical adenopathy.  Neurological: She is alert and oriented to person, place, and time. No cranial nerve deficit. She exhibits  normal muscle tone. Coordination normal.  Skin: Skin is warm and dry.  Psychiatric: She has a normal mood and affect.     ED Treatments / Results  Labs (all labs ordered are listed, but only abnormal results are displayed) Labs Reviewed  COMPREHENSIVE METABOLIC PANEL - Abnormal; Notable for the following components:      Result Value   Glucose, Bld 102 (*)    Total Protein 8.4 (*)    All other components within normal limits  CBC WITH DIFFERENTIAL/PLATELET - Abnormal; Notable for the following components:   Neutro Abs 8.0 (*)    All other components within normal limits  URINALYSIS, ROUTINE W REFLEX MICROSCOPIC - Abnormal; Notable for the following components:   Specific Gravity, Urine 1.031 (*)    Ketones, ur 5 (*)    Protein, ur 100 (*)    All other components within normal limits  RAPID URINE DRUG SCREEN, HOSP PERFORMED - Abnormal; Notable for the following components:   Tetrahydrocannabinol POSITIVE (*)    All other components within normal limits  LIPASE, BLOOD  D-DIMER, QUANTITATIVE (NOT AT [facility])  TSH  T3 UPTAKE  POCT I-STAT TROPONIN I  I-STAT BETA HCG BLOOD, ED (NOT ORDERABLE)  I-STAT BETA HCG BLOOD, ED ([facility], [facility], [facility] ONLY)  I-STAT TROPONIN, ED    EKG EKG Interpretation  Date/Time:  Sunday November 06 2018 19:40:37 EST Ventricular Rate:  122 PR Interval:    QRS Duration: 65 QT Interval:  379 QTC Calculation: 540 R Axis:   97 Text Interpretation:  Sinus tachycardia LAE, consider biatrial enlargement Borderline right axis deviation Borderline T abnormalities, diffuse leads Prolonged QT interval agree. no old comparison Confirmed by Arby BarrettePfeiffer, Suliman Termini [id number]) on 11/06/2018 9:05:28 PM   Radiology Dg Chest 2 View  Result Date: 11/06/2018 CLINICAL DATA:  Shortness of breath, cough x2 months EXAM: CHEST - 2 VIEW COMPARISON:  None. FINDINGS: Lungs are clear.  No pleural effusion or pneumothorax. The heart is normal in size. Visualized osseous structures are within normal  limits. IMPRESSION: Normal chest radiographs. Electronically Signed   By: Charline BillsSriyesh  Krishnan M.D.   On: 11/06/2018 20:25    Procedures Procedures (including critical care time)  Medications Ordered in ED Medications - No data to display   Initial Impression / Assessment and Plan / ED Course  I have reviewed the triage vital signs and the nursing notes.  Pertinent labs & imaging results that were available during my care of the patient were reviewed by me and considered in my medical decision making (see chart for details).    Patient presents with about 2 months of feeling short of breath intermittently, palpitations and chest pains.  D-dimer is not elevated.  Patient does not have lower extremity swelling or calf pain.  Reportedly, patient  is following up with endocrinology for T4 abnormality.  TSH is normal and patient does not have any palpable thyromegaly she does not show signs of thyroid storm or physical signs of uncontrolled hyperthyroidism.  I do feel patient is safe to continue outpatient follow-up as planned.  Patient does smoke marijuana and her boyfriend has noted that the symptoms do get more pronounced sometimes after smoking.  Night particularly shortly after smoking she started to feel short of breath and used her rescue inhaler and then went on to feel that she was having palpitations and lightheaded.  I have counseled that at this point best approach is to completely discontinue use of marijuana or any other drugs or stimulants, Atarax prescribed to be taken as needed, recommendation to follow-up with cardiology, return precautions reviewed.  Final Clinical Impressions(s) / ED Diagnoses   Final diagnoses:  Dyspnea, unspecified type  Palpitation    ED Discharge Orders         Ordered    hydrOXYzine (ATARAX/VISTARIL) 25 MG tablet  Every 6 hours PRN     11/06/18 2341           Arby Barrette, MD 11/06/18 2348

## 2018-11-08 LAB — T3 UPTAKE: T3 Uptake Ratio: 25 % (ref 24–39)

## 2019-02-01 ENCOUNTER — Encounter: Payer: Self-pay | Admitting: *Deleted

## 2019-02-03 ENCOUNTER — Encounter: Payer: Self-pay | Admitting: Diagnostic Neuroimaging

## 2019-02-03 ENCOUNTER — Encounter

## 2019-02-03 ENCOUNTER — Ambulatory Visit (INDEPENDENT_AMBULATORY_CARE_PROVIDER_SITE_OTHER): Payer: BLUE CROSS/BLUE SHIELD | Admitting: Diagnostic Neuroimaging

## 2019-02-03 VITALS — BP 119/78 | HR 77 | Ht 64.0 in | Wt 130.2 lb

## 2019-02-03 DIAGNOSIS — R4689 Other symptoms and signs involving appearance and behavior: Secondary | ICD-10-CM | POA: Diagnosis not present

## 2019-02-03 DIAGNOSIS — G43109 Migraine with aura, not intractable, without status migrainosus: Secondary | ICD-10-CM

## 2019-02-03 MED ORDER — RIZATRIPTAN BENZOATE 10 MG PO TABS: ORAL_TABLET | ORAL | 6 refills | Status: AC

## 2019-02-03 MED ORDER — TOPIRAMATE 50 MG PO TABS: 50.0000 mg | ORAL_TABLET | Freq: Two times a day (BID) | ORAL | 12 refills | Status: DC

## 2019-02-03 NOTE — Patient Instructions (Signed)
MIGRAINE WITH AURA - MIGRAINE PREVENTION --> start topiramate 50mg  at bedtime; after 1 week increase to twice a day; drink plenty of water - MIGRAINE RESCUE --> rizatriptan 10mg  as needed for breakthrough headache; may repeat x 1 after 2 hours; max 2 tabs per day or 8 per month   ABNORMAL SPELL / ANXIETY / ZONE OUT - check MRI brain and EEG

## 2019-02-03 NOTE — Progress Notes (Signed)
[facility]  PATIENT: Rachel Farrell DOB: 03/14/1996  REFERRING CLINICIAN: Merla Riches, PA-c HISTORY FROM: patient  REASON FOR VISIT: new consult   HISTORICAL  CHIEF COMPLAINT:  Chief Complaint  Patient presents with  . Migraine    rm 7 New Pt, hx of 3 concussions, migraines x 3 years, getting more frequent and worse w/photo/phonia sensitivity, nausea, having a migraine at least twice monthly lasting 3-4 days, Sumatriptan helps cut down on length; really bad headaches fairly often; instances of passing out with flashing lights- no memory of event"     HISTORY OF PRESENT ILLNESS:   23 year old female here for evaluation of headaches.  Patient has had headaches since high school with pain and pressure sensation treated with Tylenol.  In the last 3 years patient has had different of headache with unilateral right greater than left-sided throbbing pain sensation with nausea, lasting hours - days at a time.  Sometimes she feels puffiness in her eyelids and face. Patient having 2-4 headaches per month.  Patient also has history of multiple concussions (3 head injuries in the past).  Patient also having intermittent episodes of dissociation and anxiety.  She feels disconnected from reality.  She feels out of body sensation.  This is been going on for the past 1 year.  These seem to be triggered by strobe lights.  She has had 2 or 3 episodes where she passed out following exposure to strobe light at night club.    REVIEW OF SYSTEMS: Full 14 system review of systems performed and negative with exception of: Memory loss confusion insomnia dizziness anxiety suicidal thoughts essential activities racing thoughts allergies ringing in ears birthmark shortness of breath chest pain palpitation.   ALLERGIES: Allergies  Allergen Reactions  . Molds & Smuts Itching and Shortness Of Breath  . Penicillins Hives    Has patient had a PCN reaction causing immediate rash,  facial/tongue/throat swelling, SOB or lightheadedness with hypotension: Yes Has patient had a PCN reaction causing severe rash involving mucus membranes or skin necrosis: No Has patient had a PCN reaction that required hospitalization: No Has patient had a PCN reaction occurring within the last 10 years: No If all of the above answers are "NO", then may proceed with Cephalosporin use.   Marland Kitchen Honey Rash  . Latex Itching and Rash    HOME MEDICATIONS: Outpatient Medications Prior to Visit  Medication Sig Dispense Refill  . FLOVENT HFA 110 MCG/ACT inhaler Inhale 1 puff into the lungs daily.  1  . hydrOXYzine (ATARAX/VISTARIL) 25 MG tablet Take 1 tablet (25 mg total) by mouth every 6 (six) hours as needed. 20 tablet 0  . montelukast (SINGULAIR) 10 MG tablet Take 1 tablet (10 mg total) by mouth at bedtime. 30 tablet 0  . Norethindrone-Ethinyl Estradiol-Fe Biphas (LO LOESTRIN FE) 1 MG-10 MCG / 10 MCG tablet Take 1 tablet by mouth daily.     Marland Kitchen PROAIR HFA 108 (90 Base) MCG/ACT inhaler Inhale 1-2 puffs into the lungs every 6 (six) hours as needed for wheezing or shortness of breath.   0  . SUMAtriptan (IMITREX) 25 MG tablet 25 mg as needed.     No facility-administered medications prior to visit.     PAST MEDICAL HISTORY: Past Medical History:  Diagnosis Date  . Asthma   . Concussion    hx of 3  . Headache   . Migraines   . Syncope    3 episodes  . Thyroid disease     PAST  SURGICAL HISTORY: Past Surgical History:  Procedure Laterality Date  . CHOLECYSTECTOMY  04/2018    FAMILY HISTORY: Family History  Problem Relation Age of Onset  . Migraines Mother   . CVA Mother        age 1  . Hypertension Brother   . Stroke Maternal Grandmother   . Diabetes Maternal Grandmother   . Thyroid disease Maternal Grandmother     SOCIAL HISTORY: Social History   Socioeconomic History  . Marital status: Single    Spouse name: Not on file  . Number of children: 0  . Years of education: HS  +  . Highest education level: Not on file  Occupational History    Comment: student [employer]  Social Needs  . Financial resource strain: Not on file  . Food insecurity:    Worry: Not on file    Inability: Not on file  . Transportation needs:    Medical: Not on file    Non-medical: Not on file  Tobacco Use  . Smoking status: Former Smoker    Types: Cigarettes    Last attempt to quit: 05/05/2018    Years since quitting: 0.7  . Smokeless tobacco: Never Used  Substance and Sexual Activity  . Alcohol use: Never    Frequency: Never  . Drug use: Never  . Sexual activity: Not on file  Lifestyle  . Physical activity:    Days per week: Not on file    Minutes per session: Not on file  . Stress: Not on file  Relationships  . Social connections:    Talks on phone: Not on file    Gets together: Not on file    Attends religious service: Not on file    Active member of club or organization: Not on file    Attends meetings of clubs or organizations: Not on file    Relationship status: Not on file  . Intimate partner violence:    Fear of current or ex partner: Not on file    Emotionally abused: Not on file    Physically abused: Not on file    Forced sexual activity: Not on file  Other Topics Concern  . Not on file  Social History Narrative   Lives on campus   Caffeine- coffee or tea, maybe 1 cup daily     PHYSICAL EXAM  GENERAL EXAM/CONSTITUTIONAL: Vitals:  Vitals:   02/03/19 0811  BP: 119/78  Pulse: 77  Weight: 130 lb 3.2 oz (59.1 kg)  Height:  (1.626 m)     Body mass index is 22.35 kg/m. Wt Readings from Last 3 Encounters:  02/03/19 130 lb 3.2 oz (59.1 kg)  11/06/18 130 lb (59 kg)  04/15/18 130 lb 14.4 oz (59.4 kg) (56 %, Z= 0.15)*   * Growth percentiles are based on CDC (Girls, 2-20 Years) data.     Patient is in no distress; well developed, nourished and groomed; neck is supple  CARDIOVASCULAR:  Examination of carotid arteries is normal; no carotid  bruits  Regular rate and rhythm, no murmurs  Examination of peripheral vascular system by observation and palpation is normal  EYES:  Ophthalmoscopic exam of optic discs and posterior segments is normal; no papilledema or hemorrhages  Visual Acuity Screening   Right eye Left eye Both eyes  Without correction: 20/20 20/30   With correction:        MUSCULOSKELETAL:  Gait, strength, tone, movements noted in Neurologic exam below  NEUROLOGIC: MENTAL STATUS:  No flowsheet  data found.  awake, alert, oriented to person, place and time  recent and remote memory intact  normal attention and concentration  language fluent, comprehension intact, naming intact  fund of knowledge appropriate  CRANIAL NERVE:   2nd - no papilledema on fundoscopic exam  2nd, 3rd, 4th, 6th - pupils equal and reactive to light, visual fields full to confrontation, extraocular muscles intact, no nystagmus  5th - facial sensation symmetric  7th - facial strength symmetric  8th - hearing intact  9th - palate elevates symmetrically, uvula midline  11th - shoulder shrug symmetric  12th - tongue protrusion midline  MOTOR:   normal bulk and tone, full strength in the BUE, BLE  SENSORY:   normal and symmetric to light touch, temperature, vibration  COORDINATION:   finger-nose-finger, fine finger movements normal  REFLEXES:   deep tendon reflexes present and symmetric  GAIT/STATION:   narrow based gait     DIAGNOSTIC DATA (LABS, IMAGING, TESTING) - I reviewed patient records, labs, notes, testing and imaging myself where available.  Lab Results  Component Value Date   WBC 9.5 11/06/2018   HGB 14.7 11/06/2018   HCT 45.2 11/06/2018   MCV 90.9 11/06/2018   PLT 379 11/06/2018      Component Value Date/Time   NA 138 11/06/2018 2158   K 3.8 11/06/2018 2158   CL 106 11/06/2018 2158   CO2 22 11/06/2018 2158   GLUCOSE 102 (H) 11/06/2018 2158   BUN 14 11/06/2018 2158    CREATININE 0.91 11/06/2018 2158   CREATININE 0.95 04/08/2018 1120   CALCIUM 9.7 11/06/2018 2158   PROT 8.4 (H) 11/06/2018 2158   ALBUMIN 4.7 11/06/2018 2158   AST 22 11/06/2018 2158   AST 18 04/08/2018 1120   ALT 21 11/06/2018 2158   ALT 16 04/08/2018 1120   ALKPHOS 58 11/06/2018 2158   BILITOT 0.7 11/06/2018 2158   BILITOT 0.5 04/08/2018 1120   GFRNONAA >60 11/06/2018 2158   GFRNONAA >60 04/08/2018 1120   GFRAA >60 11/06/2018 2158   GFRAA >60 04/08/2018 1120   No results found for: CHOL, HDL, LDLCALC, LDLDIRECT, TRIG, CHOLHDL No results found for: ONGE9B No results found for: VITAMINB12 Lab Results  Component Value Date   TSH 0.365 11/06/2018       ASSESSMENT AND PLAN  23 y.o. year old female here with headaches since middle school with change 3 years ago with migraine features.  Also with multiple episodes of passing out, usually triggered by strobe light.   Dx:  1. Migraine with aura and without status migrainosus, not intractable   2. Spell of abnormal behavior     PLAN:  MIGRAINE WITH AURA - MIGRAINE PREVENTION --> start topiramate  at bedtime; after 1 week increase to twice a day; drink plenty of water - MIGRAINE RESCUE --> rizatriptan  as needed for breakthrough headache; may repeat x 1 after 2 hours; max 2 tabs per day or 8 per month  DISSOCIATIVE SPELL / ANXIETY / ZONE OUT / PASS OUT (rule out seizure; could be related to underlying bipolar d/o) - check MRI brain and EEG  Orders Placed This Encounter  Procedures  . MR BRAIN W WO CONTRAST  . EEG adult   Meds ordered this encounter  Medications  . topiramate (TOPAMAX) 50 MG tablet    Sig: Take 1 tablet (50 mg total) by mouth 2 (two) times daily.    Dispense:  60 tablet    Refill:  12  . rizatriptan (MAXALT)  10 MG tablet    Sig: As needed for headache; may repeat in 2 hours if needed; max 2 per day or 8 per month    Dispense:  8 tablet    Refill:  6   Return in about 3 months (around  05/06/2019).    Suanne Marker, MD 02/03/2019, 8:46 AM Certified in Neurology, Neurophysiology and Neuroimaging  [facility] [address] [place], [place] [address] [phone]

## 2019-02-06 ENCOUNTER — Encounter: Payer: Self-pay | Admitting: Diagnostic Neuroimaging

## 2019-02-06 ENCOUNTER — Telehealth: Payer: Self-pay | Admitting: Diagnostic Neuroimaging

## 2019-02-06 NOTE — Telephone Encounter (Signed)
[employer] Berkley Harvey: [phone] (exp. 02/06/19 to 03/07/19)/medicaid order sent to [facility]. They will reach out to the pt to schedule.

## 2019-02-08 ENCOUNTER — Telehealth: Payer: Self-pay | Admitting: Diagnostic Neuroimaging

## 2019-02-08 NOTE — Telephone Encounter (Signed)
Pt called wanting to know if she can cut down in half her medication topiramate (TOPAMAX) 50 MG tablet that was prescribed to her or if she can stop taking it due to getting nausea w/cramps, feeling weak, legs feeling like Jello and constantly feeling tiered. She states she has been on it for 4 days and the first two days she was feeling great but the these last two days is when she started the symptoms and she has done nothing different than usual. Please advise.

## 2019-02-08 NOTE — Telephone Encounter (Signed)
Called patient and advised her she can stop topiramate until symptoms go away and retry or she can spilt tablet and see if symptoms resolve. She can then increase back to 50 mg once a day. Advised she may need to give more time to adjust to medication. She stated she sometimes doesn't tolerate medications well. She wants to take 1/2 tablet for a while to see if symptoms resolve.  I advised she call for any questions, concerns and to let us know how she is doing. She verbalized understanding, appreciation.

## 2019-02-14 ENCOUNTER — Other Ambulatory Visit: Payer: Self-pay

## 2019-02-14 ENCOUNTER — Ambulatory Visit
Admission: RE | Admit: 2019-02-14 | Discharge: 2019-02-14 | Disposition: A | Discharge status: Home / Self Care | Payer: Medicaid Other | Source: Ambulatory Visit | Attending: Diagnostic Neuroimaging | Admitting: Diagnostic Neuroimaging

## 2019-02-14 DIAGNOSIS — R4689 Other symptoms and signs involving appearance and behavior: Secondary | ICD-10-CM

## 2019-02-14 MED ORDER — GADOBENATE DIMEGLUMINE 529 MG/ML IV SOLN
12.0000 mL | Freq: Once | INTRAVENOUS | Status: AC | PRN
  Administered 2019-02-14: 12 mL via INTRAVENOUS

## 2019-02-16 ENCOUNTER — Other Ambulatory Visit: Payer: Self-pay

## 2019-02-16 ENCOUNTER — Ambulatory Visit (INDEPENDENT_AMBULATORY_CARE_PROVIDER_SITE_OTHER): Payer: BLUE CROSS/BLUE SHIELD

## 2019-02-16 DIAGNOSIS — R2990 Unspecified symptoms and signs involving the nervous system: Secondary | ICD-10-CM | POA: Diagnosis not present

## 2019-02-16 DIAGNOSIS — R4689 Other symptoms and signs involving appearance and behavior: Secondary | ICD-10-CM

## 2019-02-20 ENCOUNTER — Telehealth: Payer: Self-pay | Admitting: Diagnostic Neuroimaging

## 2019-02-20 NOTE — Telephone Encounter (Signed)
Medicaid Berkley Harvey: [id number] (exp. 02/13/19 to 03/15/19) pt had MRI at [facility] on 02/14/19

## 2019-02-20 NOTE — Telephone Encounter (Signed)
Pt request MRI and EEG results. Please call to advise

## 2019-02-21 NOTE — Telephone Encounter (Signed)
Spoke with patient and informed her that her MRI brain result is unremarkable. Advised she'll get a call when EEG results are available. I advised she continue taking migraine meds as prescribed. Patient verbalized understanding, appreciation.

## 2019-03-01 NOTE — Telephone Encounter (Signed)
Called patient and informed her that her EEG was normal. She had no questions,  verbalized understanding, appreciation.

## 2019-03-01 NOTE — Telephone Encounter (Signed)
eeg is normal. -VRP 

## 2019-03-20 NOTE — Procedures (Signed)
   [facility]  EEG (ELECTROENCEPHALOGRAM) REPORT   STUDY DATE: 02/16/19 PATIENT NAME: Rachel Farrell DOB: 24-Dec-1995 MRN: [id number]  ORDERING CLINICIAN: Joycelyn Schmid, MD   TECHNOLOGIST: Charlett Blake TECHNIQUE: Electroencephalogram was recorded utilizing standard 10-20 system of lead placement and reformatted into average and bipolar montages.  RECORDING TIME: 21 minutes ACTIVATION: hyperventilation and photic stimulation  CLINICAL INFORMATION: 23 year old female with abnormal spells.  FINDINGS: Posterior dominant background rhythms, which attenuate with eye opening, ranging 9-10 hertz and 40-50 microvolts. No focal, lateralizing, epileptiform activity or seizures are seen. Patient recorded in the awake and drowsy state. EKG channel shows regular rhythm of 75-80 beats per minute.   IMPRESSION:   Normal EEG in the awake and drowsy states.    INTERPRETING PHYSICIAN:  Suanne Marker, MD Certified in Neurology, Neurophysiology and Neuroimaging  [facility] [address] [place], [place] [address] [phone]

## 2019-05-25 ENCOUNTER — Telehealth: Payer: Self-pay | Admitting: *Deleted

## 2019-05-25 NOTE — Telephone Encounter (Signed)
Called patient to discuss converting her FU to my chart video visit due to Covid 19.  She consented,  e mail sent for her to activate her [facility] my chart. Updated her EMR. She stated topamax is causing hand numbness,  her headaches are breaking through, and on the verge of a migraine. She would like to discuss coming off topamax. She also reported waking up with "seizure-like activity, feeling like she's been shocked with electricity". She stated "I don't think I am having a seizure". I advised her I will let Dr Leta Baptist know of her concerns. She verbalized understanding, appreciation.

## 2019-05-25 NOTE — Telephone Encounter (Signed)
Setup revist pls. -VRP

## 2019-05-29 ENCOUNTER — Telehealth (INDEPENDENT_AMBULATORY_CARE_PROVIDER_SITE_OTHER): Payer: BC Managed Care – PPO | Admitting: Diagnostic Neuroimaging

## 2019-05-29 DIAGNOSIS — R4689 Other symptoms and signs involving appearance and behavior: Secondary | ICD-10-CM

## 2019-05-29 DIAGNOSIS — G43109 Migraine with aura, not intractable, without status migrainosus: Secondary | ICD-10-CM | POA: Diagnosis not present

## 2019-05-29 MED ORDER — AJOVY 225 MG/1.5ML ~~LOC~~ SOAJ: 225.0000 mg | SUBCUTANEOUS | 4 refills | Status: AC

## 2019-05-29 NOTE — Telephone Encounter (Signed)
Patient has my chart video visit today at 2 pm.

## 2019-05-29 NOTE — Progress Notes (Signed)
    Virtual Visit via Video Note  I connected with Rachel Farrell on 05/29/19 at  2:00 PM EDT by a video enabled telemedicine application and verified that I am speaking with the correct person using two identifiers.   I discussed the limitations of evaluation and management by telemedicine and the availability of in person appointments. The patient expressed understanding and agreed to proceed.  Patient is at their home. I am at the office.    History of Present Illness:  - UPDATE (05/29/19, VRP): Since last visit, HA slightly better (1-2 per month) and spells slightly better. However more side effects from TPX (numbness; more irregular menstrual cycle). No alleviating or aggravating factors. Tolerating sumatriptan.  PRIOR HPI (02/03/19): 23 year old female here for evaluation of headaches.  Patient has had headaches since high school with pain and pressure sensation treated with Tylenol.  In the last 3 years patient has had different of headache with unilateral right greater than left-sided throbbing pain sensation with nausea, lasting hours - days at a time.  Sometimes she feels puffiness in her eyelids and face. Patient having 2-4 headaches per month.  Patient also has history of multiple concussions (3 head injuries in the past).  Patient also having intermittent episodes of dissociation and anxiety.  She feels disconnected from reality.  She feels out of body sensation.  This is been going on for the past 1 year.  These seem to be triggered by strobe lights.  She has had 2 or 3 episodes where she passed out following exposure to strobe light at night club.     Observations/Objective:  - awake, alert - face symm, no dysarthria - lang fluent; comp intact   02/14/19 MRI brain  - normal  02/16/19 EEG - normal   Assessment and Plan:  Dx:  1. Migraine with aura and without status migrainosus, not intractable   2. Spell of abnormal behavior      MIGRAINE TREATMENT  -  trial of CGRP antagonist (ajovy) --> cannot take propranolol (side effect), depakote (weight gain), TCAs (currently on SSRI) - taper off topiramate (side effects of numbness and irregular cycle) - continue sumatriptan 50mg  as needed  DISSOCIATIVE SPELL / ANXIETY / ZONE OUT / PASS OUT (could be related to underlying bipolar d/o; MRI and EEG normal) - monitor   Follow Up Instructions:  - Return in about 4 months (around 09/28/2019) for with NP (Amy Lomax).    I discussed the assessment and treatment plan with the patient. The patient was provided an opportunity to ask questions and all were answered. The patient agreed with the plan and demonstrated an understanding of the instructions.   The patient was advised to call back or seek an in-person evaluation if the symptoms worsen or if the condition fails to improve as anticipated.  I provided 15 minutes of non-face-to-face time during this encounter.   Penni Bombard, MD 6/43/3295, 1:88 PM Certified in Neurology, Neurophysiology and Neuroimaging  [facility] [address], [place], [place] [address] [phone]

## 2019-06-12 ENCOUNTER — Encounter: Payer: Self-pay | Admitting: *Deleted

## 2019-08-24 ENCOUNTER — Encounter: Payer: Self-pay | Admitting: *Deleted

## 2019-08-24 ENCOUNTER — Telehealth: Payer: Self-pay | Admitting: *Deleted

## 2019-08-24 NOTE — Telephone Encounter (Signed)
Sent patient my chart message with questions re: Ajovy PA through [employer].

## 2019-08-28 ENCOUNTER — Encounter: Payer: Self-pay | Admitting: *Deleted

## 2019-08-28 NOTE — Telephone Encounter (Signed)
Per [employer], Ajovy approved x 365 days, quantity #[id number]. Sent patient my chart to let her know.

## 2019-09-02 ENCOUNTER — Emergency Department (HOSPITAL_COMMUNITY): Payer: Medicaid Other

## 2019-09-02 ENCOUNTER — Other Ambulatory Visit: Payer: Self-pay

## 2019-09-02 ENCOUNTER — Emergency Department (HOSPITAL_COMMUNITY)
Admission: EM | Admit: 2019-09-02 | Discharge: 2019-09-03 | Disposition: A | Discharge status: Home / Self Care | Payer: Medicaid Other | Attending: Emergency Medicine | Admitting: Emergency Medicine

## 2019-09-02 ENCOUNTER — Encounter (HOSPITAL_COMMUNITY): Payer: Self-pay | Admitting: Emergency Medicine

## 2019-09-02 DIAGNOSIS — Z87891 Personal history of nicotine dependence: Secondary | ICD-10-CM | POA: Insufficient documentation

## 2019-09-02 DIAGNOSIS — Z79899 Other long term (current) drug therapy: Secondary | ICD-10-CM | POA: Insufficient documentation

## 2019-09-02 DIAGNOSIS — Z91040 Latex allergy status: Secondary | ICD-10-CM | POA: Insufficient documentation

## 2019-09-02 DIAGNOSIS — H8110 Benign paroxysmal vertigo, unspecified ear: Secondary | ICD-10-CM | POA: Diagnosis not present

## 2019-09-02 DIAGNOSIS — I951 Orthostatic hypotension: Secondary | ICD-10-CM | POA: Diagnosis not present

## 2019-09-02 DIAGNOSIS — R079 Chest pain, unspecified: Secondary | ICD-10-CM | POA: Diagnosis present

## 2019-09-02 LAB — CBC
HCT: 42.8 % (ref 36.0–46.0)
Hemoglobin: 13.9 g/dL (ref 12.0–15.0)
MCH: 30.3 pg (ref 26.0–34.0)
MCHC: 32.5 g/dL (ref 30.0–36.0)
MCV: 93.2 fL (ref 80.0–100.0)
Platelets: 316 10*3/uL (ref 150–400)
RBC: 4.59 MIL/uL (ref 3.87–5.11)
RDW: 12.1 % (ref 11.5–15.5)
WBC: 5.2 10*3/uL (ref 4.0–10.5)
nRBC: 0 % (ref 0.0–0.2)

## 2019-09-02 LAB — BASIC METABOLIC PANEL
Anion gap: 9 (ref 5–15)
BUN: 15 mg/dL (ref 6–20)
CO2: 21 mmol/L — ABNORMAL LOW (ref 22–32)
Calcium: 9.2 mg/dL (ref 8.9–10.3)
Chloride: 107 mmol/L (ref 98–111)
Creatinine, Ser: 0.87 mg/dL (ref 0.44–1.00)
GFR calc Af Amer: >60 mL/min (ref >60)
GFR calc non Af Amer: >60 mL/min (ref >60)
Glucose, Bld: 112 mg/dL — ABNORMAL HIGH (ref 70–99)
Potassium: 4 mmol/L (ref 3.5–5.1)
Sodium: 137 mmol/L (ref 135–145)

## 2019-09-02 LAB — D-DIMER, QUANTITATIVE: D-Dimer, Quant: 0.31 ug/mL-FEU (ref 0.00–0.50)

## 2019-09-02 LAB — I-STAT BETA HCG BLOOD, ED (MC, WL, AP ONLY): I-stat hCG, quantitative: <5 m[IU]/mL (ref <5)

## 2019-09-02 LAB — TROPONIN I (HIGH SENSITIVITY): Troponin I (High Sensitivity): <2 ng/L (ref <18)

## 2019-09-02 MED ORDER — SODIUM CHLORIDE 0.9 % IV BOLUS
1000.0000 mL | Freq: Once | INTRAVENOUS | Status: AC
  Administered 2019-09-03: 1000 mL via INTRAVENOUS

## 2019-09-02 MED ORDER — SODIUM CHLORIDE 0.9% FLUSH: 3.0000 mL | Freq: Once | INTRAVENOUS | Status: DC

## 2019-09-02 MED ORDER — MECLIZINE HCL 25 MG PO TABS
25.0000 mg | ORAL_TABLET | Freq: Once | ORAL | Status: AC
  Administered 2019-09-02: 25 mg via ORAL
  Filled 2019-09-02: qty 1

## 2019-09-02 NOTE — ED Triage Notes (Addendum)
Pt reports chest pain and dizziness since last night.

## 2019-09-02 NOTE — ED Provider Notes (Signed)
[facility]-EMERGENCY DEPT Provider Note   CSN: [id number] Arrival date & time: 09/02/19  1959     History   Chief Complaint Chief Complaint  Patient presents with   Chest Pain   Dizziness    HPI Rachel Farrell is a 23 y.o. female with a history of asthma, syncope, subclinical hyperthyroid disease, migraines who presents to the emergency department with a chief complaint of dizziness.  The patient reports that for the last 24 hours that she has been having constant dizziness and lightheadedness.  She reports the dizziness feels like the room spinning and is worse with positional changes of her head.  She reports that she has been feeling lightheaded with standing and walking.  She reports that she has been feeling very fatigued.  She reports associated chest tightness and pressure in the middle of her chest that is accompanied by shortness of breath and is worse with exertion.  She does note that she has been slightly more sweaty with exertion.  She has a history of asthma but states " this pain feels as if my chest wall is crushing her lungs".  She also endorses epigastric discomfort, nausea, and 3 episodes of loose stools that are green in color.  She has also been having a headache that is different from her typical migraines.  She states that her headache seems to start at the base of her jaw and radiates back toward her scalp.  No vomiting, diarrhea, constipation, melena or hematochezia, sore throat, otalgia, tinnitus, neck pain or stiffness, cough, fever, chills, leg swelling, numbness, weakness, or GU complaints.  No increased burping or belching.  She is [facility] by [facility] and is previously had a normal EEG and MRI for dissociative spell/anxiety/zoning out/syncope.  No recent medication adjustments.  Per chart review, it does appear that she has had to be changed from several of her migraine regimens due to side effects (propranolol,  Topamax, and Depakote).   No alcohol use.  She is a non-smoker.  No IV or recreational drug use.  No known or suspected COVID-19 contacts.  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  She reports a familial history of blood clots.  Her mother has a history of von Willebrand's disease, but the patient tested negative.  She takes OCPs.     The history is provided by the patient. No language interpreter was used.    Past Medical History:  Diagnosis Date   Asthma    Concussion    hx of 3   Headache    Migraines    Syncope    3 episodes   Thyroid disease     There are no active problems to display for this patient.   Past Surgical History:  Procedure Laterality Date   CHOLECYSTECTOMY  04/2018     OB History   No obstetric history on file.      Home Medications    Prior to Admission medications   Medication Sig Start Date End Date Taking? Authorizing Provider  acetaminophen (TYLENOL) 500 MG tablet Take 500-1,000 mg by mouth every 6 (six) hours as needed for moderate pain.   Yes [provider]  FLOVENT HFA 110 MCG/ACT inhaler Inhale 1 puff into the lungs daily. 09/23/18  Yes [provider]  Fremanezumab-vfrm (AJOVY) 225 MG/1.5ML SOAJ Inject 225 mg into the skin every 30 (thirty) days. 05/29/19  Yes Penumalli, Glenford BayleyVikram R, MD  levonorgestrel (PLAN B ONE-STEP) 1.5 MG tablet Take 1.5 mg by mouth  once.   Yes [provider]  montelukast (SINGULAIR) 10 MG tablet Take 1 tablet (10 mg total) by mouth at bedtime. 09/02/18  Yes Elvina Sidle, MD  Norethindrone-Ethinyl Estradiol-Fe Biphas (LO LOESTRIN FE) 1 MG-10 MCG / 10 MCG tablet Take 1 tablet by mouth daily.  03/10/16  Yes [provider]  ORILISSA 150 MG TABS Take 150 mg by mouth daily.  08/05/19  Yes [provider]  PROAIR HFA 108 (90 Base) MCG/ACT inhaler Inhale 1-2 puffs into the lungs every 6 (six) hours as needed for wheezing or shortness of breath.  09/23/18  Yes [provider]  sertraline (ZOLOFT) 50 MG tablet Take 50 mg by mouth daily.   Yes [provider]  meclizine (ANTIVERT) 12.5 MG tablet Take 1 tablet (12.5 mg total) by mouth 3 (three) times daily as needed for dizziness. 09/03/19   Aayla Marrocco A, PA-C  rizatriptan (MAXALT) 10 MG tablet As needed for headache; may repeat in 2 hours if needed; max 2 per day or 8 per month Patient taking differently: Take 10 mg by mouth as needed for migraine. As needed for headache; may repeat in 2 hours if needed; max 2 per day or 8 per month 02/03/19   Penumalli, Glenford Bayley, MD  SUMAtriptan (IMITREX) 25 MG tablet Take 25 mg by mouth every 2 (two) hours as needed for migraine.  12/12/18   [provider]  topiramate (TOPAMAX) 50 MG tablet Take 1 tablet (50 mg total) by mouth 2 (two) times daily. Patient not taking: Reported on 09/02/2019 02/03/19 09/03/19  Penumalli, Glenford Bayley, MD    Family History Family History  Problem Relation Age of Onset   Migraines Mother    CVA Mother        age 12   Hypertension Brother    Stroke Maternal Grandmother    Diabetes Maternal Grandmother    Thyroid disease Maternal Grandmother     Social History Social History   Tobacco Use   Smoking status: Former Smoker    Types: Cigarettes    Quit date: 05/05/2018    Years since quitting: 1.3   Smokeless tobacco: Never Used  Substance Use Topics   Alcohol use: Never    Frequency: Never   Drug use: Never     Allergies   Molds & smuts, Penicillins, Topiramate, Honey, and Latex   Review of Systems Review of Systems  Constitutional: Positive for diaphoresis and fatigue. Negative for activity change, chills and fever.  Respiratory: Positive for shortness of breath. Negative for cough and wheezing.   Cardiovascular: Positive for chest pain. Negative for palpitations and leg swelling.  Gastrointestinal: Positive for abdominal pain and nausea. Negative for constipation, diarrhea and vomiting.  Genitourinary:  Negative for dysuria, flank pain, hematuria, urgency, vaginal discharge and vaginal pain.  Musculoskeletal: Negative for back pain, myalgias, neck pain and neck stiffness.  Skin: Negative for rash.  Allergic/Immunologic: Negative for immunocompromised state.  Neurological: Positive for dizziness, light-headedness and headaches. Negative for seizures, syncope, weakness and numbness.  Psychiatric/Behavioral: Negative for confusion.     Physical Exam Updated Vital Signs BP 115/84    Pulse 74    Temp 98.8 F (37.1 C) (Oral)    Resp (!) 22    Ht  (1.626 m)    Wt 59.4 kg    LMP 08/31/2019    SpO2 100%    BMI 22.49 kg/m   Physical Exam Vitals signs and nursing note reviewed.  Constitutional:  General: She is not in acute distress.    Appearance: She is not ill-appearing, toxic-appearing or diaphoretic.  HENT:     Head: Normocephalic.     Right Ear: Tympanic membrane and ear canal normal.     Mouth/Throat:     Mouth: Mucous membranes are moist.     Pharynx: No oropharyngeal exudate or posterior oropharyngeal erythema.  Eyes:     General: No scleral icterus.    Extraocular Movements: Extraocular movements intact.     Conjunctiva/sclera: Conjunctivae normal.     Pupils: Pupils are equal, round, and reactive to light.  Neck:     Musculoskeletal: Normal range of motion and neck supple.  Cardiovascular:     Rate and Rhythm: Normal rate and regular rhythm.     Pulses: Normal pulses.     Heart sounds: Normal heart sounds. No murmur. No friction rub. No gallop.   Pulmonary:     Effort: Pulmonary effort is normal. No respiratory distress.     Breath sounds: No stridor. No wheezing, rhonchi or rales.     Comments: Lungs are clear to auscultation bilaterally.  Good air movement.  No increased work of breathing. Chest:     Chest wall: No tenderness.  Abdominal:     General: There is no distension.     Palpations: Abdomen is soft. There is no mass.     Tenderness: There is abdominal  tenderness. There is no right CVA tenderness, left CVA tenderness, guarding or rebound.     Hernia: No hernia is present.     Comments: Minimal epigastric tenderness with palpation.  No rebound or guarding.  Negative Murphy sign.  Normoactive bowel sounds.  Abdomen is soft and nondistended.  Musculoskeletal:     Right lower leg: No edema.     Left lower leg: No edema.  Skin:    General: Skin is warm.     Capillary Refill: Capillary refill takes 2 to 3 seconds.     Findings: No rash.  Neurological:     General: No focal deficit present.     Mental Status: She is alert.     Comments: Cranial nerves II through XII are grossly intact.  5-5 strength against resistance of the bilateral upper and lower extremities.  Sensation is intact and equal throughout.  Ambulatory without difficulty.  Negative Romberg.  No pronator drift.  Psychiatric:        Behavior: Behavior normal.      ED Treatments / Results  Labs (all labs ordered are listed, but only abnormal results are displayed) Labs Reviewed  BASIC METABOLIC PANEL - Abnormal; Notable for the following components:      Result Value   CO2 21 (*)    Glucose, Bld 112 (*)    All other components within normal limits  CBC  D-DIMER, QUANTITATIVE (NOT AT [facility])  I-STAT BETA HCG BLOOD, ED ([facility], [facility], [facility] ONLY)  TROPONIN I (HIGH SENSITIVITY)    EKG EKG Interpretation  Date/Time:  Saturday September 02 2019 20:45:37 EDT Ventricular Rate:  91 PR Interval:    QRS Duration: 79 QT Interval:  362 QTC Calculation: 446 R Axis:   58 Text Interpretation:  Sinus rhythm Probable left atrial enlargement No significant change since last tracing Confirmed by Blanchie Dessert [phone]) on 09/02/2019 10:41:41 PM   Radiology Dg Chest 2 View  Result Date: 09/02/2019 CLINICAL DATA:  Chest pain EXAM: CHEST - 2 VIEW COMPARISON:  November 06, 2018 FINDINGS: The heart size and mediastinal contours  are within normal limits. Both lungs are clear. The visualized  skeletal structures are unremarkable. Surgical clips in the right upper quadrant. IMPRESSION: No acute cardiopulmonary process. Electronically Signed   By: Jonna Clark M.D.   On: 09/02/2019 21:39    Procedures Procedures (including critical care time)  Medications Ordered in ED Medications  sodium chloride flush (NS) 0.9 % injection 3 mL (3 mLs Intravenous Not Given 09/02/19 2311)  meclizine (ANTIVERT) tablet 25 mg (25 mg Oral Given 09/02/19 2331)  sodium chloride 0.9 % bolus 1,000 mL (0 mLs Intravenous Stopped 09/03/19 0110)     Initial Impression / Assessment and Plan / ED Course  I have reviewed the triage vital signs and the nursing notes.  Pertinent labs & imaging results that were available during my care of the patient were reviewed by me and considered in my medical decision making (see chart for details).        23 year old female with a history of asthma, syncope, subclinical hyperthyroid disease, migraines who presents with dizziness, lightheadedness, chest pain and tightness, dyspnea on exertion, generalized malaise, nausea, and epigastric pain for the last 24 hours.  No constitutional symptoms.  On exam, she has no focal neurologic deficits.  Her only focal finding is mild epigastric tenderness to palpation on abdominal exam.  With initial orthostatics, blood pressure remained stable, but the patient endorses lightheadedness and shortness of breath with standing.  Will give IV fluids.  Meclizine given for dizziness.  Although she describes constant dizziness, it sounds as if she is having intermittent dizziness with head movements.  On reevaluation, the patient does endorse that dizziness has been intermittent.  She has been feeling more lightheaded, which is been more constant, but does seem to exacerbate with standing.  Although labs do not suggest dehydration, she does have a slightly increased capillary refill, 2 to 3 seconds.  Chest x-ray is unremarkable.  Given exertional  dyspnea, d-dimer was ordered, which was negative.  Low suspicion for PE.  No electrolyte derangements.  Initial troponin is negative.  EKG unchanged from previous.  Low suspicion for ACS, PE, aortic dissection, esophageal rupture, venous cavernous thrombosis, CVA, ICH, or aortic aneurysm.  I also reviewed the patient's brain MRI from March 2020, which was unremarkable.  The patient was discussed with Dr. Rhunette Croft, attending physician, who is in agreement with the work up and plan and recommends repeating orthostatics vitals signs and ambulating the patient after fluid bolus.  Considered medication reaction, but patient has not had no recent medication adjustments.  Following fluid bolus, patient is asymptomatic.  She is feeling much better.  Lightheadedness has resolved.  We will discharge the patient home with a low-dose of meclizine and recommended outpatient follow-up.  All questions answered.  She is hemodynamically stable and in no acute distress.  Safe discharge home with outpatient follow-up.  Final Clinical Impressions(s) / ED Diagnoses   Final diagnoses:  Orthostatic hypotension  Benign paroxysmal positional vertigo, unspecified laterality    ED Discharge Orders         Ordered    meclizine (ANTIVERT) 12.5 MG tablet  3 times daily PRN     09/03/19 0208           Frederik Pear A, PA-C 09/03/19 0347    Derwood Kaplan, MD 09/03/19 [phone]

## 2019-09-02 NOTE — ED Notes (Signed)
Pt reported dizziness, lightheadedness, and shob when standing during orthostatic vital signs. RN made aware.

## 2019-09-02 NOTE — ED Notes (Signed)
Patient transported to X-ray 

## 2019-09-03 MED ORDER — MECLIZINE HCL 12.5 MG PO TABS: 12.5000 mg | ORAL_TABLET | Freq: Three times a day (TID) | ORAL | 0 refills | Status: AC | PRN

## 2019-09-03 NOTE — Discharge Instructions (Signed)
Thank you for allowing me to care for you today in the Emergency Department.   Call the number on your discharge paperwork to get established with primary care for follow-up.  Make sure that you are drinking at least 64 ounces of fluid daily.  You can take 1 tablet of meclizine if you are feeling dizzy.  This is half the dose you were given in the ER.  If dizziness persists, follow-up with your neurologist.  Return to the emergency department if you develop respiratory distress, high fevers, persistent vomiting, significant changes in her gait or if you have recurrent falls, or other new, concerning symptoms.

## 2019-09-08 ENCOUNTER — Ambulatory Visit
Admission: EM | Admit: 2019-09-08 | Discharge: 2019-09-08 | Disposition: A | Discharge status: Home / Self Care | Payer: Medicaid Other

## 2019-09-08 ENCOUNTER — Other Ambulatory Visit: Payer: Self-pay

## 2019-09-08 ENCOUNTER — Encounter: Payer: Self-pay | Admitting: Emergency Medicine

## 2019-09-08 DIAGNOSIS — M25562 Pain in left knee: Secondary | ICD-10-CM | POA: Diagnosis not present

## 2019-09-08 DIAGNOSIS — M25561 Pain in right knee: Secondary | ICD-10-CM

## 2019-09-08 DIAGNOSIS — M79621 Pain in right upper arm: Secondary | ICD-10-CM

## 2019-09-08 NOTE — ED Triage Notes (Signed)
Patient presents to [facility] for assessment after being seen in the ER this past weekend for orthostatic hypotension.  Patient was started on Antivert for her dizziness at that time.  Patient states since then she has noticed an increase in the severity of her night sweats that she has been having since April.  Patient states she has been feeling fatigued.  Patient states she has had an increase in her back pain in the last few days, as well as bilateral leg pain (posterior knee) starting 3 days ago.  Also c/o right axillary pain with a "swollen lymph node".

## 2019-09-08 NOTE — Discharge Instructions (Addendum)
Primary care daily to discuss current symptoms seen on further work-up only. In the interim, if you develop worsening pain, fevers, chest pain, loss of consciousness go to the ER for more prompt evaluation.

## 2019-09-08 NOTE — ED Provider Notes (Signed)
[facility]    CSN: [id number] Arrival date & time: 09/08/19  1126      History   Chief Complaint Chief Complaint  Patient presents with  . Leg Pain  . axilla pain    HPI Rachel Farrell is a 23 y.o. female presenting for right axillary pain, swelling, bilateral posterior knee pain.  Patient states right axillary pain has been for the last week or so, noted bilateral popliteal pain 3 days ago.  Patient denies trauma to the area, fever, malaise.  Patient notes that she was seen in the ER last weekend for orthostatic hypotension: Given Antivert.  Patient states meclizine helps with her symptoms and she has some: Denies increase in severity/frequency.  No chest pain, palpitations, SOB, nausea during these episodes.  Overall feels symptoms prompting ER visit last week have improved.  Of note, [place] care [place] in [place], has appointment pending in November.   Past Medical History:  Diagnosis Date  . Asthma   . Concussion    hx of 3  . Headache   . Migraines   . Syncope    3 episodes  . Thyroid disease    hyperthyroidism    There are no active problems to display for this patient.   Past Surgical History:  Procedure Laterality Date  . CHOLECYSTECTOMY  04/2018    OB History   No obstetric history on file.      Home Medications    Prior to Admission medications   Medication Sig Start Date End Date Taking? Authorizing Provider  montelukast (SINGULAIR) 10 MG tablet Take 1 tablet (10 mg total) by mouth at bedtime. 09/02/18  Yes Elvina SidleLauenstein, Kurt, MD  Norethindrone-Ethinyl Estradiol-Fe Biphas (LO LOESTRIN FE) 1 MG-10 MCG / 10 MCG tablet Take 1 tablet by mouth daily.  03/10/16  Yes [provider]  ORILISSA 150 MG TABS Take 150 mg by mouth daily.  08/05/19  Yes [provider]  PROAIR HFA 108 (90 Base) MCG/ACT inhaler Inhale 1-2 puffs into the lungs every 6 (six) hours as needed for wheezing or shortness of breath.  09/23/18  Yes  [provider]  rizatriptan (MAXALT) 10 MG tablet As needed for headache; may repeat in 2 hours if needed; max 2 per day or 8 per month Patient taking differently: Take 10 mg by mouth as needed for migraine. As needed for headache; may repeat in 2 hours if needed; max 2 per day or 8 per month 02/03/19  Yes Penumalli, Vikram R, MD  sertraline (ZOLOFT) 50 MG tablet Take 50 mg by mouth daily.   Yes [provider]  acetaminophen (TYLENOL) 500 MG tablet Take 500-1,000 mg by mouth every 6 (six) hours as needed for moderate pain.    [provider]  Cranberry 450 MG TABS Take 1 tablet by mouth daily as needed (uti prevention).    [provider]  FLOVENT HFA 110 MCG/ACT inhaler Inhale 1 puff into the lungs 2 (two) times daily.  09/23/18   [provider]  Fremanezumab-vfrm (AJOVY) 225 MG/1.5ML SOAJ Inject 225 mg into the skin every 30 (thirty) days. 05/29/19   Penumalli, Glenford BayleyVikram R, MD  meclizine (ANTIVERT) 12.5 MG tablet Take 1 tablet (12.5 mg total) by mouth 3 (three) times daily as needed for dizziness. 09/03/19   McDonald, Mia A, PA-C  methocarbamol (ROBAXIN-750) 750 MG tablet Take 1 tablet (750 mg total) by mouth 4 (four) times daily. 09/09/19   Lorre NickAllen, Anthony, MD  predniSONE (DELTASONE) 50  MG tablet 1 p.o. daily x5 09/09/19   Lorre Nick, MD  topiramate (TOPAMAX) 50 MG tablet Take 1 tablet (50 mg total) by mouth 2 (two) times daily. Patient not taking: Reported on 09/02/2019 02/03/19 09/03/19  Suanne Marker, MD    Family History Family History  Problem Relation Age of Onset  . Migraines Mother   . CVA Mother        age 72  . Hypertension Brother   . Stroke Maternal Grandmother   . Diabetes Maternal Grandmother   . Thyroid disease Maternal Grandmother     Social History Social History   Tobacco Use  . Smoking status: Former Smoker    Types: Cigarettes    Quit date: 05/05/2018    Years since quitting: 1.3  . Smokeless tobacco: Never Used   Substance Use Topics  . Alcohol use: Never    Frequency: Never  . Drug use: Never     Allergies   Molds & smuts, Penicillins, Topiramate, Honey, and Latex   Review of Systems Review of Systems  Constitutional: Negative for fatigue and fever.  HENT: Negative for ear pain, sinus pain, sore throat and voice change.   Eyes: Negative for pain, redness and visual disturbance.  Respiratory: Negative for cough and shortness of breath.   Cardiovascular: Negative for chest pain and palpitations.  Gastrointestinal: Negative for abdominal pain, diarrhea and vomiting.  Musculoskeletal:       Positive for right axillary, bilateral posterior knee pain  Skin: Negative for rash and wound.  Neurological: Negative for syncope and headaches.     Physical Exam Triage Vital Signs ED Triage Vitals  Enc Vitals Group     BP      Pulse      Resp      Temp      Temp src      SpO2      Weight      Height      Head Circumference      Peak Flow      Pain Score      Pain Loc      Pain Edu?      Excl. in GC?    No data found.  Updated Vital Signs BP 123/85 (BP Location: Left Arm)   Pulse 96   Temp 99.1 F (37.3 C) (Oral)   Resp 18   Wt 134 lb 4.8 oz (60.9 kg)   LMP 08/31/2019   SpO2 95%   BMI 23.05 kg/m   Visual Acuity Right Eye Distance:   Left Eye Distance:   Bilateral Distance:    Right Eye Near:   Left Eye Near:    Bilateral Near:     Physical Exam Constitutional:      General: She is not in acute distress. HENT:     Head: Normocephalic and atraumatic.     Mouth/Throat:     Mouth: Mucous membranes are moist.     Pharynx: Oropharynx is clear.  Eyes:     General: No scleral icterus.    Pupils: Pupils are equal, round, and reactive to light.  Neck:     Musculoskeletal: Normal range of motion. No muscular tenderness.  Cardiovascular:     Rate and Rhythm: Normal rate and regular rhythm.     Heart sounds: No murmur. No gallop.   Pulmonary:     Effort: Pulmonary  effort is normal. No respiratory distress.     Breath sounds: No wheezing.  Musculoskeletal:  Right knee: Normal.     Left knee: Normal.     Right lower leg: No edema.     Left lower leg: No edema.     Comments: Negative Homans sign bilaterally, no popliteal cords, cyst, fluctuance, warmth, masses appreciated. Right distal axilla with TTP.  No focal induration, fluctuance, mass appreciated.  No lymphadenopathy appreciated.  Patient without erythema, though warmth to palpation.  Full active ROM of shoulder without crepitus, decreased strength.  Sensation intact.  Lymphadenopathy:     Cervical: No cervical adenopathy.  Skin:    Capillary Refill: Capillary refill takes less than 2 seconds.     Coloration: Skin is not jaundiced or pale.  Neurological:     General: No focal deficit present.     Mental Status: She is alert and oriented to person, place, and time.      UC Treatments / Results  Labs (all labs ordered are listed, but only abnormal results are displayed) Labs Reviewed - No data to display  EKG   Radiology   Procedures Procedures (including critical care time)  Medications Ordered in UC Medications - No data to display  Initial Impression / Assessment and Plan / UC Course  I have reviewed the triage vital signs and the nursing notes.  Pertinent labs & imaging results that were available during my care of the patient were reviewed by me and considered in my medical decision making (see chart for details).     1.  Acute bilateral knee pain Atraumatic: Radiography deferred.  Low concern for underlying DVT at this time.  Reviewed signs/symptoms of DVT that should prompt patient for further evaluation.  Discussed possible Baker's cyst, despite not being palpated on exam.  Further evaluation would need to be done via ultrasound, though this process does not appear to be emergent: This provider recommended patient follow-up with PCP to see if outpatient orders can be  placed.  Return precautions discussed, patient verbalized understanding and is agreeable to plan.  2.  Axillary pain, right No discernible lymphadenopathy on exam, the patient is tender in the right distal axilla.  Discussed that lymphadenitis, possible forming abscess are on differential, though less likely given lack of systemic signs/symptoms. This provider offered short-term antibiotic use to see if this would improve symptoms: Patient declined, electing to follow-up with primary care.  Return precautions discussed, patient verbalized understanding and is agreeable to plan.  Final Clinical Impressions(s) / UC Diagnoses   Final diagnoses:  Axillary pain, right  Acute bilateral knee pain     Discharge Instructions     Primary care daily to discuss current symptoms seen on further work-up only. In the interim, if you develop worsening pain, fevers, chest pain, loss of consciousness go to the ER for more prompt evaluation.    ED Prescriptions    None     PDMP not reviewed this encounter.   Hall-Potvin, [place], [place] 09/09/19 1313

## 2019-09-08 NOTE — ED Notes (Signed)
Patient able to ambulate independently  

## 2019-09-09 ENCOUNTER — Emergency Department (HOSPITAL_BASED_OUTPATIENT_CLINIC_OR_DEPARTMENT_OTHER): Payer: Medicaid Other

## 2019-09-09 ENCOUNTER — Encounter: Payer: Self-pay | Admitting: Emergency Medicine

## 2019-09-09 ENCOUNTER — Encounter (HOSPITAL_COMMUNITY): Payer: Self-pay | Admitting: *Deleted

## 2019-09-09 ENCOUNTER — Other Ambulatory Visit: Payer: Self-pay

## 2019-09-09 ENCOUNTER — Emergency Department (HOSPITAL_COMMUNITY)
Admission: EM | Admit: 2019-09-09 | Discharge: 2019-09-09 | Disposition: A | Discharge status: Home / Self Care | Payer: Medicaid Other | Attending: Emergency Medicine | Admitting: Emergency Medicine

## 2019-09-09 DIAGNOSIS — Z87891 Personal history of nicotine dependence: Secondary | ICD-10-CM | POA: Diagnosis not present

## 2019-09-09 DIAGNOSIS — M79605 Pain in left leg: Secondary | ICD-10-CM | POA: Diagnosis not present

## 2019-09-09 DIAGNOSIS — E039 Hypothyroidism, unspecified: Secondary | ICD-10-CM | POA: Diagnosis not present

## 2019-09-09 DIAGNOSIS — Z79899 Other long term (current) drug therapy: Secondary | ICD-10-CM | POA: Insufficient documentation

## 2019-09-09 DIAGNOSIS — M79604 Pain in right leg: Secondary | ICD-10-CM | POA: Diagnosis not present

## 2019-09-09 DIAGNOSIS — M7989 Other specified soft tissue disorders: Secondary | ICD-10-CM | POA: Diagnosis not present

## 2019-09-09 LAB — CBC WITH DIFFERENTIAL/PLATELET
Abs Immature Granulocytes: 0.01 10*3/uL (ref 0.00–0.07)
Basophils Absolute: 0 10*3/uL (ref 0.0–0.1)
Basophils Relative: 0 %
Eosinophils Absolute: 0.1 10*3/uL (ref 0.0–0.5)
Eosinophils Relative: 1 %
HCT: 39.9 % (ref 36.0–46.0)
Hemoglobin: 12.9 g/dL (ref 12.0–15.0)
Immature Granulocytes: 0 %
Lymphocytes Relative: 39 %
Lymphs Abs: 1.7 10*3/uL (ref 0.7–4.0)
MCH: 30.1 pg (ref 26.0–34.0)
MCHC: 32.3 g/dL (ref 30.0–36.0)
MCV: 93 fL (ref 80.0–100.0)
Monocytes Absolute: 0.4 10*3/uL (ref 0.1–1.0)
Monocytes Relative: 8 %
Neutro Abs: 2.2 10*3/uL (ref 1.7–7.7)
Neutrophils Relative %: 52 %
Platelets: 284 10*3/uL (ref 150–400)
RBC: 4.29 MIL/uL (ref 3.87–5.11)
RDW: 12 % (ref 11.5–15.5)
WBC: 4.3 10*3/uL (ref 4.0–10.5)
nRBC: 0 % (ref 0.0–0.2)

## 2019-09-09 LAB — CK: Total CK: 68 U/L (ref 38–234)

## 2019-09-09 LAB — COMPREHENSIVE METABOLIC PANEL
ALT: 18 U/L (ref 0–44)
AST: 15 U/L (ref 15–41)
Albumin: 3.5 g/dL (ref 3.5–5.0)
Alkaline Phosphatase: 51 U/L (ref 38–126)
Anion gap: 8 (ref 5–15)
BUN: 9 mg/dL (ref 6–20)
CO2: 23 mmol/L (ref 22–32)
Calcium: 9 mg/dL (ref 8.9–10.3)
Chloride: 105 mmol/L (ref 98–111)
Creatinine, Ser: 0.82 mg/dL (ref 0.44–1.00)
GFR calc Af Amer: >60 mL/min (ref >60)
GFR calc non Af Amer: >60 mL/min (ref >60)
Glucose, Bld: 130 mg/dL — ABNORMAL HIGH (ref 70–99)
Potassium: 3.7 mmol/L (ref 3.5–5.1)
Sodium: 136 mmol/L (ref 135–145)
Total Bilirubin: 0.4 mg/dL (ref 0.3–1.2)
Total Protein: 6.9 g/dL (ref 6.5–8.1)

## 2019-09-09 LAB — TSH: TSH: 1.017 u[IU]/mL (ref 0.350–4.500)

## 2019-09-09 MED ORDER — PREDNISONE 50 MG PO TABS: ORAL_TABLET | ORAL | 0 refills | Status: DC

## 2019-09-09 MED ORDER — METHOCARBAMOL 750 MG PO TABS: 750.0000 mg | ORAL_TABLET | Freq: Four times a day (QID) | ORAL | 0 refills | Status: AC

## 2019-09-09 NOTE — Progress Notes (Signed)
Lower extremity venous has been completed.   Preliminary results in CV Proc.   Abram Sander 09/09/2019 8:22 AM

## 2019-09-09 NOTE — ED Provider Notes (Signed)
[facility]-EMERGENCY DEPT Provider Note   CSN: [id number] Arrival date & time: 09/09/19  9244     History   Chief Complaint Chief Complaint  Patient presents with  . Leg Pain    HPI Rachel Farrell is a 23 y.o. female.     23 year old female who presents with 1week pain that starts in her popliteal fossa this extends into her posterior thighs.  Also complains of having mass in her right axilla which she feels may be a lymph node.  Denies any fever or chills.  Endorses subjective dyspnea but denies any cough or congestion.  Seen a week ago for similar symptoms and was diagnosed with orthostatic hypotension as well as vertigo.  States that her symptoms have not improved since that time.  No recent history of night sweats or weight loss.     Past Medical History:  Diagnosis Date  . Asthma   . Concussion    hx of 3  . Headache   . Migraines   . Syncope    3 episodes  . Thyroid disease    hyperthyroidism    There are no active problems to display for this patient.   Past Surgical History:  Procedure Laterality Date  . CHOLECYSTECTOMY  04/2018     OB History   No obstetric history on file.      Home Medications    Prior to Admission medications   Medication Sig Start Date End Date Taking? Authorizing Provider  acetaminophen (TYLENOL) 500 MG tablet Take 500-1,000 mg by mouth every 6 (six) hours as needed for moderate pain.    [provider]  FLOVENT HFA 110 MCG/ACT inhaler Inhale 1 puff into the lungs daily. 09/23/18   [provider]  Fremanezumab-vfrm (AJOVY) 225 MG/1.5ML SOAJ Inject 225 mg into the skin every 30 (thirty) days. 05/29/19   Penumalli, Glenford Bayley, MD  levonorgestrel (PLAN B ONE-STEP) 1.5 MG tablet Take 1.5 mg by mouth once.    [provider]  meclizine (ANTIVERT) 12.5 MG tablet Take 1 tablet (12.5 mg total) by mouth 3 (three) times daily as needed for dizziness. 09/03/19   McDonald, Mia A, PA-C   montelukast (SINGULAIR) 10 MG tablet Take 1 tablet (10 mg total) by mouth at bedtime. 09/02/18   Elvina Sidle, MD  Norethindrone-Ethinyl Estradiol-Fe Biphas (LO LOESTRIN FE) 1 MG-10 MCG / 10 MCG tablet Take 1 tablet by mouth daily.  03/10/16   [provider]  ORILISSA 150 MG TABS Take 150 mg by mouth daily.  08/05/19   [provider]  PROAIR HFA 108 (90 Base) MCG/ACT inhaler Inhale 1-2 puffs into the lungs every 6 (six) hours as needed for wheezing or shortness of breath.  09/23/18   [provider]  rizatriptan (MAXALT) 10 MG tablet As needed for headache; may repeat in 2 hours if needed; max 2 per day or 8 per month Patient taking differently: Take 10 mg by mouth as needed for migraine. As needed for headache; may repeat in 2 hours if needed; max 2 per day or 8 per month 02/03/19   Penumalli, Vikram R, MD  sertraline (ZOLOFT) 50 MG tablet Take 50 mg by mouth daily.    [provider]  SUMAtriptan (IMITREX) 25 MG tablet Take 25 mg by mouth every 2 (two) hours as needed for migraine.  12/12/18   [provider]  topiramate (TOPAMAX) 50 MG tablet Take 1 tablet (50 mg total) by mouth 2 (two) times daily. Patient  not taking: Reported on 09/02/2019 02/03/19 09/03/19  Penni Bombard, MD    Family History Family History  Problem Relation Age of Onset  . Migraines Mother   . CVA Mother        age 106  . Hypertension Brother   . Stroke Maternal Grandmother   . Diabetes Maternal Grandmother   . Thyroid disease Maternal Grandmother     Social History Social History   Tobacco Use  . Smoking status: Former Smoker    Types: Cigarettes    Quit date: 05/05/2018    Years since quitting: 1.3  . Smokeless tobacco: Never Used  Substance Use Topics  . Alcohol use: Never    Frequency: Never  . Drug use: Never     Allergies   Molds & smuts, Penicillins, Topiramate, Honey, and Latex   Review of Systems Review of Systems  All other systems reviewed and  are negative.    Physical Exam Updated Vital Signs BP (!) 146/103 (BP Location: Right Arm)   Pulse 98   Temp 99.2 F (37.3 C)   Resp 16   LMP 08/31/2019   SpO2 96%   Physical Exam Vitals signs and nursing note reviewed.  Constitutional:      General: She is not in acute distress.    Appearance: Normal appearance. She is well-developed. She is not toxic-appearing.  HENT:     Head: Normocephalic and atraumatic.  Eyes:     General: Lids are normal.     Conjunctiva/sclera: Conjunctivae normal.     Pupils: Pupils are equal, round, and reactive to light.  Neck:     Musculoskeletal: Normal range of motion and neck supple.     Thyroid: No thyroid mass.     Trachea: No tracheal deviation.  Cardiovascular:     Rate and Rhythm: Normal rate and regular rhythm.     Heart sounds: Normal heart sounds. No murmur. No gallop.   Pulmonary:     Effort: Pulmonary effort is normal. No respiratory distress.     Breath sounds: Normal breath sounds. No stridor. No decreased breath sounds, wheezing, rhonchi or rales.  Abdominal:     General: Bowel sounds are normal. There is no distension.     Palpations: Abdomen is soft.     Tenderness: There is no abdominal tenderness. There is no rebound.  Musculoskeletal: Normal range of motion.        General: No tenderness.     Comments: Palpable lymph node in right axilla  No palpable nodes and supraclavicular or popliteal fossa's.  Compartments soft on lower extremities  Skin:    General: Skin is warm and dry.     Findings: No abrasion or rash.  Neurological:     Mental Status: She is alert and oriented to person, place, and time.     GCS: GCS eye subscore is 4. GCS verbal subscore is 5. GCS motor subscore is 6.     Cranial Nerves: No cranial nerve deficit.     Sensory: No sensory deficit.  Psychiatric:        Speech: Speech normal.        Behavior: Behavior normal.      ED Treatments / Results  Labs (all labs ordered are listed, but only  abnormal results are displayed) Labs Reviewed  CBC WITH DIFFERENTIAL/PLATELET  COMPREHENSIVE METABOLIC PANEL  CK    EKG None  Radiology No results found.  Procedures Procedures (including critical care time)  Medications Ordered in ED Medications -  No data to display   Initial Impression / Assessment and Plan / ED Course  I have reviewed the triage vital signs and the nursing notes.  Pertinent labs & imaging results that were available during my care of the patient were reviewed by me and considered in my medical decision making (see chart for details).        Patient is labs and Doppler of her legs are negative.  CK normal.  TSH within normal months..  Possible patient has rheumatological issue going on.  Will give patient short course of prednisone as well as muscle relaxants return precautions  Final Clinical Impressions(s) / ED Diagnoses   Final diagnoses:  None    ED Discharge Orders    None       Lorre NickAllen, Rayfield Beem, MD 09/09/19 1016

## 2019-09-09 NOTE — ED Triage Notes (Signed)
Pt arrives with pain in bilateral posterior knees, also reporting some swelling behind the right knee. C/o a "knot" in the right axilla which she thinks is a lymph node. Pt said she went to urgent care for the same yesterday.

## 2019-09-09 NOTE — Discharge Instructions (Addendum)
Follow-up with your doctor in 1 to 2 weeks if not better.  Return here for persistent fever, new weakness to legs, or any other problems

## 2019-09-23 ENCOUNTER — Emergency Department (HOSPITAL_COMMUNITY): Payer: Medicaid Other

## 2019-09-23 ENCOUNTER — Encounter (HOSPITAL_COMMUNITY): Payer: Self-pay

## 2019-09-23 ENCOUNTER — Emergency Department (HOSPITAL_COMMUNITY)
Admission: EM | Admit: 2019-09-23 | Discharge: 2019-09-23 | Discharge status: Against Medical Advice | Payer: Medicaid Other | Attending: Emergency Medicine | Admitting: Emergency Medicine

## 2019-09-23 ENCOUNTER — Other Ambulatory Visit: Payer: Self-pay

## 2019-09-23 DIAGNOSIS — Z87891 Personal history of nicotine dependence: Secondary | ICD-10-CM | POA: Insufficient documentation

## 2019-09-23 DIAGNOSIS — M545 Low back pain: Secondary | ICD-10-CM | POA: Diagnosis not present

## 2019-09-23 DIAGNOSIS — M79604 Pain in right leg: Secondary | ICD-10-CM | POA: Insufficient documentation

## 2019-09-23 DIAGNOSIS — R2243 Localized swelling, mass and lump, lower limb, bilateral: Secondary | ICD-10-CM | POA: Insufficient documentation

## 2019-09-23 DIAGNOSIS — Z79899 Other long term (current) drug therapy: Secondary | ICD-10-CM | POA: Insufficient documentation

## 2019-09-23 DIAGNOSIS — R0789 Other chest pain: Secondary | ICD-10-CM | POA: Insufficient documentation

## 2019-09-23 DIAGNOSIS — J45909 Unspecified asthma, uncomplicated: Secondary | ICD-10-CM | POA: Insufficient documentation

## 2019-09-23 DIAGNOSIS — M79605 Pain in left leg: Secondary | ICD-10-CM | POA: Insufficient documentation

## 2019-09-23 DIAGNOSIS — Z91040 Latex allergy status: Secondary | ICD-10-CM | POA: Insufficient documentation

## 2019-09-23 DIAGNOSIS — Z5329 Procedure and treatment not carried out because of patient's decision for other reasons: Secondary | ICD-10-CM | POA: Diagnosis not present

## 2019-09-23 DIAGNOSIS — R0600 Dyspnea, unspecified: Secondary | ICD-10-CM | POA: Diagnosis not present

## 2019-09-23 NOTE — ED Notes (Signed)
Patient ambulatory out of treatment room into lobby. When asked, patient reports she is leaving. Ambulatory without difficulty in NAD. PA made aware.

## 2019-09-23 NOTE — ED Provider Notes (Signed)
[facility]-EMERGENCY DEPT Provider Note   CSN: [id number] Arrival date & time: 09/23/19  1550     History   Chief Complaint Chief Complaint  Patient presents with  . Leg Pain  . Back Pain    HPI Rachel Farrell is a 23 y.o. female who presents to the ED today complaining of gradual onset, constant, achy, bilateral leg pain and swelling x 3 weeks.  Patient reports that 3 weeks ago she woke up with noted pain to her bilateral popliteal areas.  Reports that the pain intermittently travels up her thighs and down her lower extremities.  She was seen in the ED on 10/10 with similar complaints.  Work-up at that time included negative DVT study.  Patient was advised to follow-up with PCP - she did not had rheumatology studies done which were all essentially negative.  At time of ED visit patient was started on prednisone which she states kept her swelling at bay but the pain was not controlled.  She has been taking Tylenol without relief.  She returns today with continued pain.  She is also complaining of worsening dyspnea on exertion and intermittent substernal chest pain.  Patient states that when she wakes up in the morning she has chest pain.  Is currently on OCPs.  Denies fever, chills, cough, hemoptysis, domino pain, nausea, vomiting, any other associated symptoms.  No history of DVT/PE.  No recent prolonged travel or immobilization.  No active malignancy.        Past Medical History:  Diagnosis Date  . Asthma   . Concussion    hx of 3  . Headache   . Migraines   . Syncope    3 episodes  . Thyroid disease    hyperthyroidism    There are no active problems to display for this patient.   Past Surgical History:  Procedure Laterality Date  . CHOLECYSTECTOMY  04/2018     OB History   No obstetric history on file.      Home Medications    Prior to Admission medications   Medication Sig Start Date End Date Taking? Authorizing Provider  acetaminophen  (TYLENOL) 500 MG tablet Take 500-1,000 mg by mouth every 6 (six) hours as needed for moderate pain.    [provider]  Cranberry 450 MG TABS Take 1 tablet by mouth daily as needed (uti prevention).    [provider]  FLOVENT HFA 110 MCG/ACT inhaler Inhale 1 puff into the lungs 2 (two) times daily.  09/23/18   [provider]  Fremanezumab-vfrm (AJOVY) 225 MG/1.5ML SOAJ Inject 225 mg into the skin every 30 (thirty) days. 05/29/19   Penumalli, Glenford BayleyVikram R, MD  meclizine (ANTIVERT) 12.5 MG tablet Take 1 tablet (12.5 mg total) by mouth 3 (three) times daily as needed for dizziness. 09/03/19   McDonald, Mia A, PA-C  methocarbamol (ROBAXIN-750) 750 MG tablet Take 1 tablet (750 mg total) by mouth 4 (four) times daily. 09/09/19   Lorre NickAllen, Anthony, MD  montelukast (SINGULAIR) 10 MG tablet Take 1 tablet (10 mg total) by mouth at bedtime. 09/02/18   Elvina SidleLauenstein, Kurt, MD  Norethindrone-Ethinyl Estradiol-Fe Biphas (LO LOESTRIN FE) 1 MG-10 MCG / 10 MCG tablet Take 1 tablet by mouth daily.  03/10/16   [provider]  ORILISSA 150 MG TABS Take 150 mg by mouth daily.  08/05/19   [provider]  predniSONE (DELTASONE) 50 MG tablet 1 p.o. daily x5 09/09/19   Lorre NickAllen, Anthony, MD  [facility]  108 (90 Base) MCG/ACT inhaler Inhale 1-2 puffs into the lungs every 6 (six) hours as needed for wheezing or shortness of breath.  09/23/18   [provider]  rizatriptan (MAXALT) 10 MG tablet As needed for headache; may repeat in 2 hours if needed; max 2 per day or 8 per month Patient taking differently: Take 10 mg by mouth as needed for migraine. As needed for headache; may repeat in 2 hours if needed; max 2 per day or 8 per month 02/03/19   Penumalli, Vikram R, MD  sertraline (ZOLOFT) 50 MG tablet Take 50 mg by mouth daily.    [provider]  topiramate (TOPAMAX) 50 MG tablet Take 1 tablet (50 mg total) by mouth 2 (two) times daily. Patient not taking: Reported on 09/02/2019  02/03/19 09/03/19  Suanne Marker, MD    Family History Family History  Problem Relation Age of Onset  . Migraines Mother   . CVA Mother        age 48  . Hypertension Brother   . Stroke Maternal Grandmother   . Diabetes Maternal Grandmother   . Thyroid disease Maternal Grandmother     Social History Social History   Tobacco Use  . Smoking status: Former Smoker    Types: Cigarettes    Quit date: 05/05/2018    Years since quitting: 1.3  . Smokeless tobacco: Never Used  Substance Use Topics  . Alcohol use: Never    Frequency: Never  . Drug use: Never     Allergies   Molds & smuts, Penicillins, Topiramate, Honey, and Latex   Review of Systems Review of Systems  Constitutional: Negative for chills and fever.  HENT: Negative for congestion.   Eyes: Negative for visual disturbance.  Respiratory: Positive for shortness of breath. Negative for cough.   Cardiovascular: Positive for chest pain and leg swelling.  Gastrointestinal: Negative for abdominal pain, constipation, diarrhea, nausea and vomiting.  Genitourinary: Negative for difficulty urinating.  Musculoskeletal: Positive for arthralgias and joint swelling.  Skin: Negative for color change.  Neurological: Negative for headaches.     Physical Exam Updated Vital Signs BP (!) 159/105 (BP Location: Left Arm)   Pulse (!) 112   Temp 99 F (37.2 C) (Oral)   Resp 17   Wt 61 kg   LMP 08/31/2019   SpO2 98%   BMI 23.08 kg/m   Physical Exam Vitals signs and nursing note reviewed.  Constitutional:      Appearance: She is not ill-appearing or diaphoretic.  HENT:     Head: Normocephalic and atraumatic.  Eyes:     Conjunctiva/sclera: Conjunctivae normal.  Neck:     Musculoskeletal: Neck supple.  Cardiovascular:     Rate and Rhythm: Normal rate and regular rhythm.     Pulses: Normal pulses.  Pulmonary:     Effort: Pulmonary effort is normal.     Breath sounds: Normal breath sounds. No wheezing, rhonchi or  rales.  Abdominal:     Palpations: Abdomen is soft.     Tenderness: There is no abdominal tenderness. There is no guarding or rebound.  Musculoskeletal:     Comments: No C, T, or L midline spinal tenderness. Diffuse lumbar paraspinal tenderness to palpation. ROM intact throughout neck and beck. Strength 5/5 to BLEs. Sensation intact throughout. 2+ DP and PT pulses.   + TTP to popliteal areas bilaterally. No obvious baker's cyst palpated. No crepitus with ROM. Negative anterior and posterior drawer test. No varus or valgus laxity. ROM  intact with knee flexion and extension.   Skin:    General: Skin is warm and dry.  Neurological:     Mental Status: She is alert.      ED Treatments / Results  Labs (all labs ordered are listed, but only abnormal results are displayed) Labs Reviewed  BASIC METABOLIC PANEL  CBC WITH DIFFERENTIAL/PLATELET  D-DIMER, QUANTITATIVE (NOT AT [facility])  PREGNANCY, URINE  URINALYSIS, ROUTINE W REFLEX MICROSCOPIC    EKG None  Radiology No results found.  Procedures Procedures (including critical care time)  Medications Ordered in ED Medications - No data to display   Initial Impression / Assessment and Plan / ED Course  I have reviewed the triage vital signs and the nursing notes.  Pertinent labs & imaging results that were available during my care of the patient were reviewed by me and considered in my medical decision making (see chart for details).    23 year old female who returns to the ED today with continued complaint of popliteal pain bilaterally.  He is also endorsing dyspnea on exertion and chest pain.  She had a negative DVT study 2 weeks ago.  Currently ultrasound tech is not here to perform DVT study.  Will order outpatient 1.  Given patient is having shortness of breath and chest pain as well as complaints of leg swelling and is on exogenous hormone will obtain chest x-ray, D-dimer, baseline screening labs and EKG.  Nursing staff informed  that patient walked out of the room and left the emergency department.  I was unable to discuss risks versus benefits of staying.  We had initially discussed scheduling an outpatient DVT study for tomorrow after lab work was obtained in the ED as well as chest x-ray.  This was not ordered at this time.  This note was prepared using Dragon voice recognition software and may include unintentional dictation errors due to the inherent limitations of voice recognition software.       Final Clinical Impressions(s) / ED Diagnoses   Final diagnoses:  Pain in both lower extremities    ED Discharge Orders    None       Eustaquio Maize, PA-C 09/23/19 2026    Sherwood Gambler, MD 09/24/19 2352

## 2019-09-23 NOTE — ED Triage Notes (Signed)
Pt arrives today c/o leg pain and swelling up to her hips. Pt states that she has shooting pains up and down her legs, and that now she also has pain up to her mid back. Pt states she had taken the prednisone, which helped with the swelling, but finished 1 week ago, and swelling has returned.

## 2019-10-04 ENCOUNTER — Encounter: Payer: Self-pay | Admitting: *Deleted

## 2019-10-04 ENCOUNTER — Telehealth: Payer: Self-pay | Admitting: *Deleted

## 2019-10-04 NOTE — Telephone Encounter (Signed)
Received fax from [facility] ; Ajovy needs PA. Sent patient a my chart. Per my chart message, patient can't get Ajovy 3 month refilled at pharmacy. They state PA needed even though PA granted x 1 year on 08/28/19. Called  [facility], spoke with Maggie who stated PA is good. She stated pharmacy is running claim for 08/22/19 which is before PA was granted. She will request to back date PA to 9/22/ 20 with end date 08/16/2020. She stated the pharmacy can be called to run Rx and it should go through.  Call ref # [id number].  Called [facility] [address], spoke with Radene Ou who stated the Rx [id number], [facility], phone [phone] on  05/29/19. If she wants to get Rx from their pharmacy she needs to [facility] [place] and have Rx transferred.  Called patient who stated she used [place] while in school. She is now getting ready to go back home. I advised her she can get [facility], but if she wants to get at [place] she needs to call [facility] [place] and get Rx transferred. She has refills until June 2021. Patient verbalized understanding, appreciation.

## 2019-10-05 ENCOUNTER — Emergency Department (HOSPITAL_COMMUNITY): Payer: Medicaid Other

## 2019-10-05 ENCOUNTER — Encounter (HOSPITAL_COMMUNITY): Payer: Self-pay

## 2019-10-05 ENCOUNTER — Emergency Department (HOSPITAL_COMMUNITY)
Admission: EM | Admit: 2019-10-05 | Discharge: 2019-10-05 | Disposition: A | Discharge status: Home / Self Care | Payer: Medicaid Other | Attending: Emergency Medicine | Admitting: Emergency Medicine

## 2019-10-05 ENCOUNTER — Other Ambulatory Visit: Payer: Self-pay

## 2019-10-05 DIAGNOSIS — M79605 Pain in left leg: Secondary | ICD-10-CM | POA: Insufficient documentation

## 2019-10-05 DIAGNOSIS — Z79899 Other long term (current) drug therapy: Secondary | ICD-10-CM | POA: Diagnosis not present

## 2019-10-05 DIAGNOSIS — Z87891 Personal history of nicotine dependence: Secondary | ICD-10-CM | POA: Diagnosis not present

## 2019-10-05 DIAGNOSIS — M79604 Pain in right leg: Secondary | ICD-10-CM | POA: Insufficient documentation

## 2019-10-05 DIAGNOSIS — J45909 Unspecified asthma, uncomplicated: Secondary | ICD-10-CM | POA: Insufficient documentation

## 2019-10-05 DIAGNOSIS — Z91040 Latex allergy status: Secondary | ICD-10-CM | POA: Insufficient documentation

## 2019-10-05 DIAGNOSIS — R2243 Localized swelling, mass and lump, lower limb, bilateral: Secondary | ICD-10-CM | POA: Diagnosis present

## 2019-10-05 LAB — POC URINE PREG, ED: Preg Test, Ur: NEGATIVE

## 2019-10-05 NOTE — Discharge Instructions (Signed)
Recommend follow-up with your primary care doctor for recheck next week.  If you develop chest pain, difficulty breathing, difficulty walking, fever or other new concerning symptom recommend return to ER for reassessment.  Otherwise, recommend Tylenol, Motrin as needed for pain control.

## 2019-10-05 NOTE — ED Triage Notes (Signed)
Pt presents with c/o bilateral leg swelling, reports the right leg is worse. Pt reports she has been here for the same recently with no definitive diagnosis. Pt reports she does remember approx 4-5 months ago she did land on her back after attempting a dance move in class at school. Pt reports that she is also have back pain. Pt also c/o nausea from the pain.

## 2019-10-05 NOTE — ED Provider Notes (Addendum)
[facility] DEPT Provider Note   CSN: [id number] Arrival date & time: 10/05/19  1607     History   Chief Complaint Chief Complaint  Patient presents with  . Leg Swelling    HPI Rachel Farrell is a 23 y.o. female.  Presents to ER with multiple complaints.  States that she is most concerned about bilateral leg swelling.  States that swelling has been going on for the past few months.  Has been seen by many different doctors and has not been given any diagnoses that explain her symptoms.  States normally her pain and swelling is normally in her lower legs but seems to have been traveling further up and now is in her hips and low back.  Denies any recent trauma.  No fevers.  No rashes.  No chest pain or difficulty breathing today.  Although, patient did report this previously had other ER visits.  No numbness, weakness, bladder or bowel incontinence, saddle anesthesia or urinary retention.  Completed chart review.  Patient has been seen in our ER multiple times in the past month for similar symptoms.  Ultrasound has been negative, dimer negative, CK negative, regular labs negative.     HPI  Past Medical History:  Diagnosis Date  . Asthma   . Concussion    hx of 3  . Headache   . Migraines   . Syncope    3 episodes  . Thyroid disease    hyperthyroidism    There are no active problems to display for this patient.   Past Surgical History:  Procedure Laterality Date  . CHOLECYSTECTOMY  04/2018     OB History   No obstetric history on file.      Home Medications    Prior to Admission medications   Medication Sig Start Date End Date Taking? Authorizing Provider  acetaminophen (TYLENOL) 500 MG tablet Take 500-1,000 mg by mouth every 6 (six) hours as needed for moderate pain.    [provider]  Cranberry 450 MG TABS Take 1 tablet by mouth daily as needed (uti prevention).    [provider]  FLOVENT HFA 110 MCG/ACT inhaler  Inhale 1 puff into the lungs 2 (two) times daily.  09/23/18   [provider]  Fremanezumab-vfrm (AJOVY) 225 MG/1.5ML SOAJ Inject 225 mg into the skin every 30 (thirty) days. 05/29/19   Penumalli, Earlean Polka, MD  meclizine (ANTIVERT) 12.5 MG tablet Take 1 tablet (12.5 mg total) by mouth 3 (three) times daily as needed for dizziness. 09/03/19   McDonald, Mia A, PA-C  methocarbamol (ROBAXIN-750) 750 MG tablet Take 1 tablet (750 mg total) by mouth 4 (four) times daily. 09/09/19   Lacretia Leigh, MD  montelukast (SINGULAIR) 10 MG tablet Take 1 tablet (10 mg total) by mouth at bedtime. 09/02/18   Robyn Haber, MD  Norethindrone-Ethinyl Estradiol-Fe Biphas (LO LOESTRIN FE) 1 MG-10 MCG / 10 MCG tablet Take 1 tablet by mouth daily.  03/10/16   [provider]  ORILISSA 150 MG TABS Take 150 mg by mouth daily.  08/05/19   [provider]  predniSONE (DELTASONE) 50 MG tablet 1 p.o. daily x5 09/09/19   Lacretia Leigh, MD  PROAIR HFA 108 [phone] Base) MCG/ACT inhaler Inhale 1-2 puffs into the lungs every 6 (six) hours as needed for wheezing or shortness of breath.  09/23/18   [provider]  rizatriptan (MAXALT) 10 MG tablet As needed for headache; may repeat in 2 hours if needed; max 2  per day or 8 per month Patient taking differently: Take 10 mg by mouth as needed for migraine. As needed for headache; may repeat in 2 hours if needed; max 2 per day or 8 per month 02/03/19   Penumalli, Vikram R, MD  sertraline (ZOLOFT) 50 MG tablet Take 50 mg by mouth daily.    [provider]  topiramate (TOPAMAX) 50 MG tablet Take 1 tablet (50 mg total) by mouth 2 (two) times daily. Patient not taking: Reported on 09/02/2019 02/03/19 09/03/19  Suanne MarkerPenumalli, Vikram R, MD    Family History Family History  Problem Relation Age of Onset  . Migraines Mother   . CVA Mother        age 855  . Hypertension Brother   . Stroke Maternal Grandmother   . Diabetes Maternal Grandmother   . Thyroid disease  Maternal Grandmother     Social History Social History   Tobacco Use  . Smoking status: Former Smoker    Types: Cigarettes    Quit date: 05/05/2018    Years since quitting: 1.4  . Smokeless tobacco: Never Used  Substance Use Topics  . Alcohol use: Never    Frequency: Never  . Drug use: Never     Allergies   Molds & smuts, Penicillins, Topiramate, Honey, and Latex   Review of Systems Review of Systems  Constitutional: Negative for chills and fever.  HENT: Negative for ear pain and sore throat.   Eyes: Negative for pain and visual disturbance.  Respiratory: Negative for cough and shortness of breath.   Cardiovascular: Negative for chest pain and palpitations.  Gastrointestinal: Negative for abdominal pain and vomiting.  Genitourinary: Negative for dysuria and hematuria.  Musculoskeletal: Positive for arthralgias and back pain.  Skin: Negative for color change and rash.  Neurological: Negative for seizures and syncope.  All other systems reviewed and are negative.    Physical Exam Updated Vital Signs BP (!) 125/97   Pulse 85   Temp 98.4 F (36.9 C) (Oral)   Resp 18   Ht 5\' 4"  (1.626 m)   Wt 61 kg   LMP 09/28/2019 (Approximate)   SpO2 100%   BMI 23.08 kg/m   Physical Exam Vitals signs and nursing note reviewed.  Constitutional:      General: She is not in acute distress.    Appearance: She is well-developed.  HENT:     Head: Normocephalic and atraumatic.  Eyes:     Conjunctiva/sclera: Conjunctivae normal.  Neck:     Musculoskeletal: Neck supple.  Cardiovascular:     Rate and Rhythm: Normal rate and regular rhythm.     Heart sounds: No murmur.  Pulmonary:     Effort: Pulmonary effort is normal. No respiratory distress.     Breath sounds: Normal breath sounds.  Abdominal:     Palpations: Abdomen is soft.     Tenderness: There is no abdominal tenderness.  Musculoskeletal:     Comments: RLE: no swelling, no deformity, normal distal pulses and sensation;  there is modest TTP in hip, normal ROM LLE: no swelling, no deformity, normal distal pulses and sensation; there is modest TTP in hip, normal ROM Back: TTP across low back, no deformity or step off  Skin:    General: Skin is warm and dry.  Neurological:     Mental Status: She is alert.      ED Treatments / Results  Labs (all labs ordered are listed, but only abnormal results are displayed) Labs Reviewed  POC  URINE PREG, ED    EKG None  Radiology Dg Lumbar Spine 2-3 Views  Result Date: 10/05/2019 CLINICAL DATA:  Hip pain, worse on the right EXAM: LUMBAR SPINE - 2-3 VIEW COMPARISON:  None. FINDINGS: There is no evidence of lumbar spine fracture. Alignment is normal. Intervertebral disc spaces are maintained. SI joints symmetric and unremarkable. Prior cholecystectomy. IMPRESSION: Negative. Electronically Signed   By: Charlett Nose M.D.   On: 10/05/2019 21:16   Dg Hip Unilat W Or Wo Pelvis 2-3 Views Right  Result Date: 10/05/2019 CLINICAL DATA:  Hip pain, right worse than left EXAM: DG HIP (WITH OR WITHOUT PELVIS) 2-3V RIGHT COMPARISON:  None. FINDINGS: There is no evidence of hip fracture or dislocation. There is no evidence of arthropathy or other focal bone abnormality. IMPRESSION: Negative. Electronically Signed   By: Charlett Nose M.D.   On: 10/05/2019 21:16    Procedures Procedures (including critical care time)  Medications Ordered in ED Medications - No data to display   Initial Impression / Assessment and Plan / ED Course  I have reviewed the triage vital signs and the nursing notes.  Pertinent labs & imaging results that were available during my care of the patient were reviewed by me and considered in my medical decision making (see chart for details).       23 year old presents to ER with sensation of leg swelling and bilateral hip pain.  On exam there is no deformity, her legs are not swollen.  She is well-appearing, ambulating without any difficulty.  Given new  hip and low back pain that she reported today, checked plain films which were negative.  Prior work-up for similar symptoms included a negative DVT study, negative dimer, negative labs.  Believe patient is appropriate for discharge and outpatient management at this time, will discharge home.    After the discussed management above, the patient was determined to be safe for discharge.  The patient was in agreement with this plan and all questions regarding their care were answered.  ED return precautions were discussed and the patient will return to the ED with any significant worsening of condition.    Final Clinical Impressions(s) / ED Diagnoses   Final diagnoses:  Pain in both lower extremities    ED Discharge Orders    None       Milagros Loll, MD 10/06/19 0050    Milagros Loll, MD 10/06/19 [phone]

## 2019-12-28 ENCOUNTER — Other Ambulatory Visit: Payer: Self-pay | Admitting: Orthopaedic Surgery

## 2019-12-28 DIAGNOSIS — M5416 Radiculopathy, lumbar region: Secondary | ICD-10-CM

## 2020-01-03 ENCOUNTER — Other Ambulatory Visit: Payer: Self-pay

## 2020-01-03 ENCOUNTER — Ambulatory Visit
Admission: RE | Admit: 2020-01-03 | Discharge: 2020-01-03 | Disposition: A | Discharge status: Home / Self Care | Payer: Medicaid Other | Source: Ambulatory Visit | Attending: Orthopaedic Surgery | Admitting: Orthopaedic Surgery

## 2020-01-03 DIAGNOSIS — M5416 Radiculopathy, lumbar region: Secondary | ICD-10-CM

## 2020-01-04 ENCOUNTER — Other Ambulatory Visit: Payer: Self-pay | Admitting: Family Medicine

## 2020-04-08 ENCOUNTER — Encounter: Payer: Self-pay | Admitting: *Deleted

## 2020-04-09 ENCOUNTER — Telehealth: Payer: Self-pay | Admitting: *Deleted

## 2020-04-09 ENCOUNTER — Encounter: Payer: Self-pay | Admitting: *Deleted

## 2020-04-09 NOTE — Telephone Encounter (Signed)
Started Arnetha Massy PA with [facility]. Form was faxed yesterday. Called [employer], spoke with Deanna. Ajovy is approved 04/08/20 - 04/03/2021. Sent patient my chart message to advise.

## 2020-06-19 ENCOUNTER — Other Ambulatory Visit: Payer: Self-pay | Admitting: Diagnostic Neuroimaging

## 2020-06-29 IMAGING — CR DG CHEST 2V
2 series · 2 of 2 positions shown · non-contrast
Comparison: None.

CLINICAL DATA: Shortness of breath, cough x2 months

EXAM:
CHEST - 2 VIEW

[w chest pa]
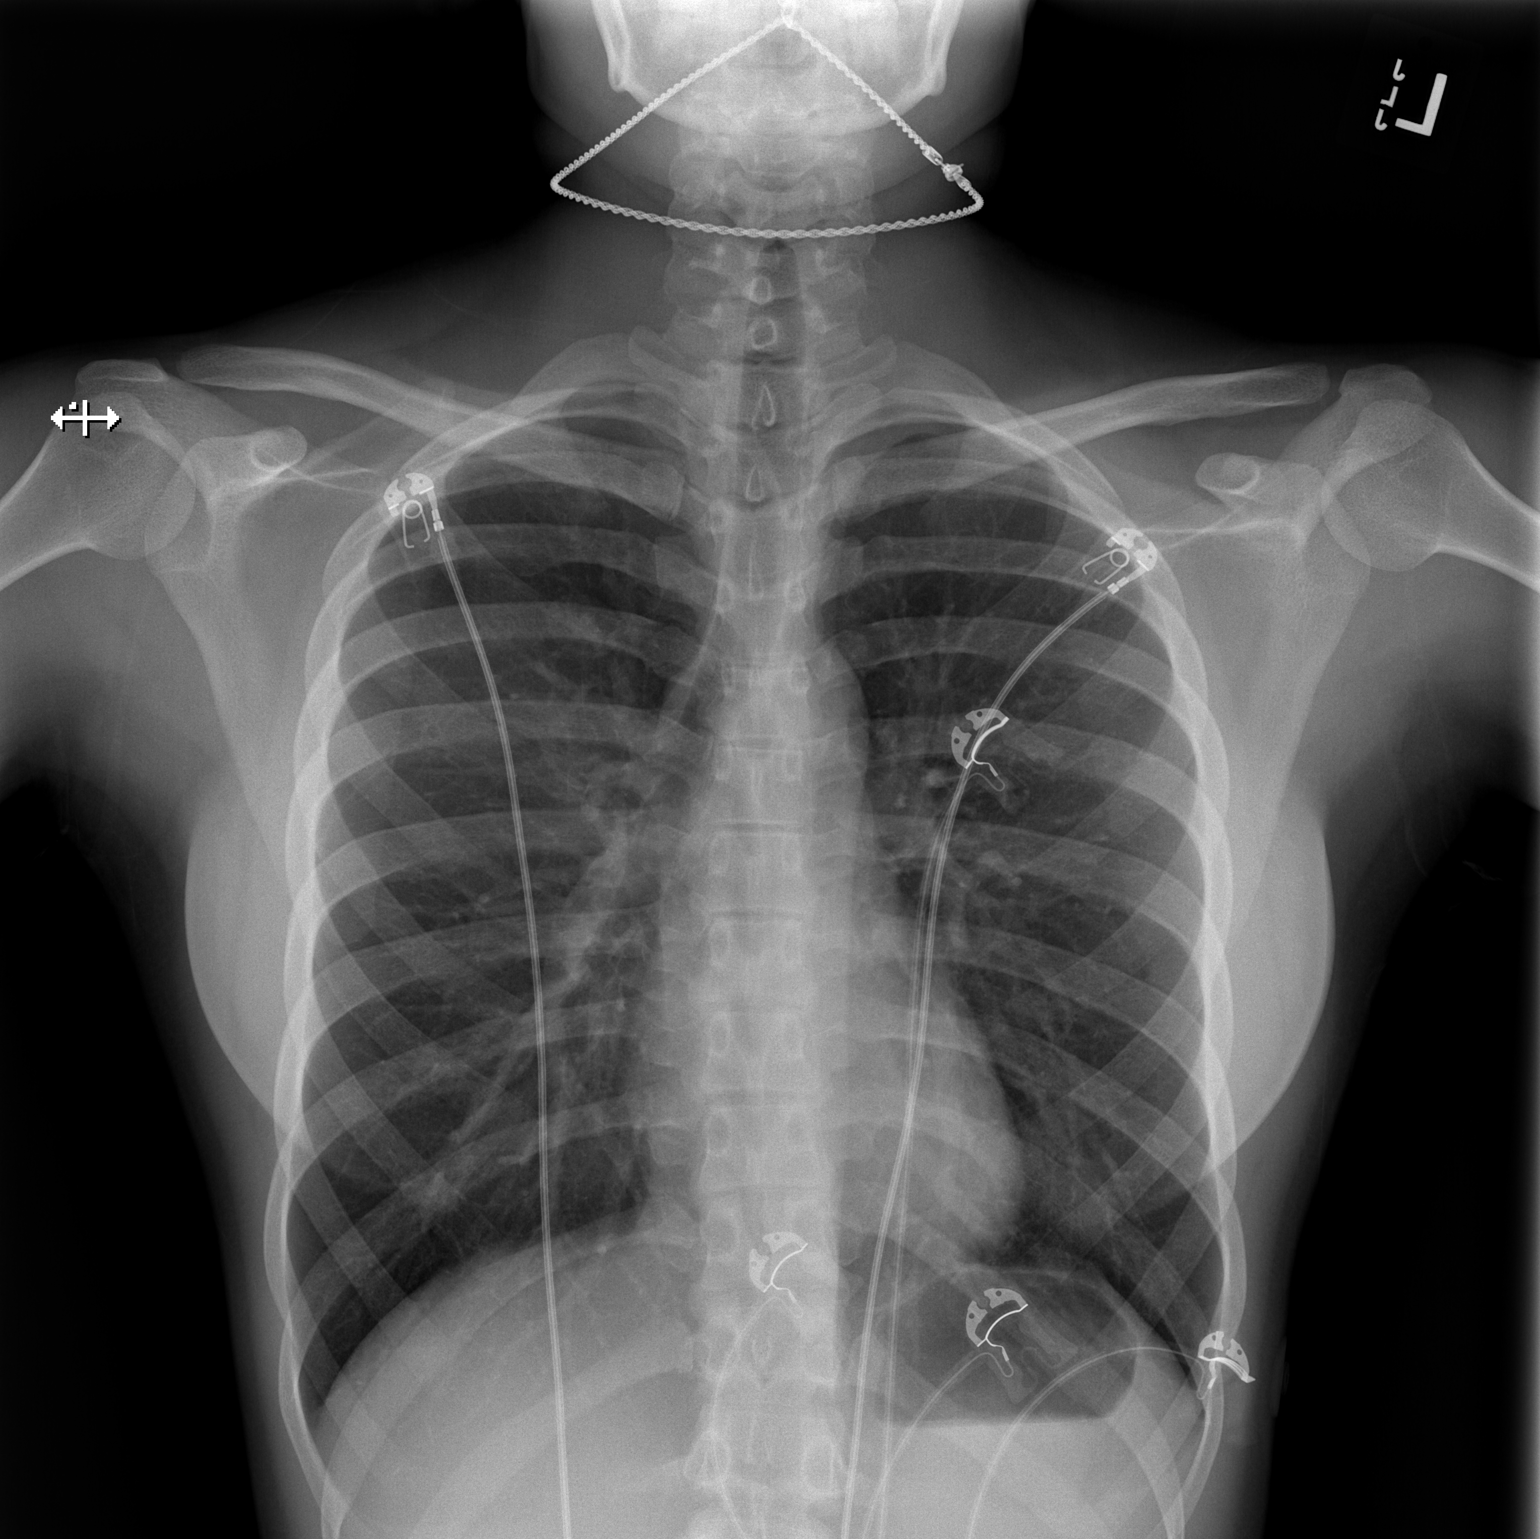

[w chest lat]
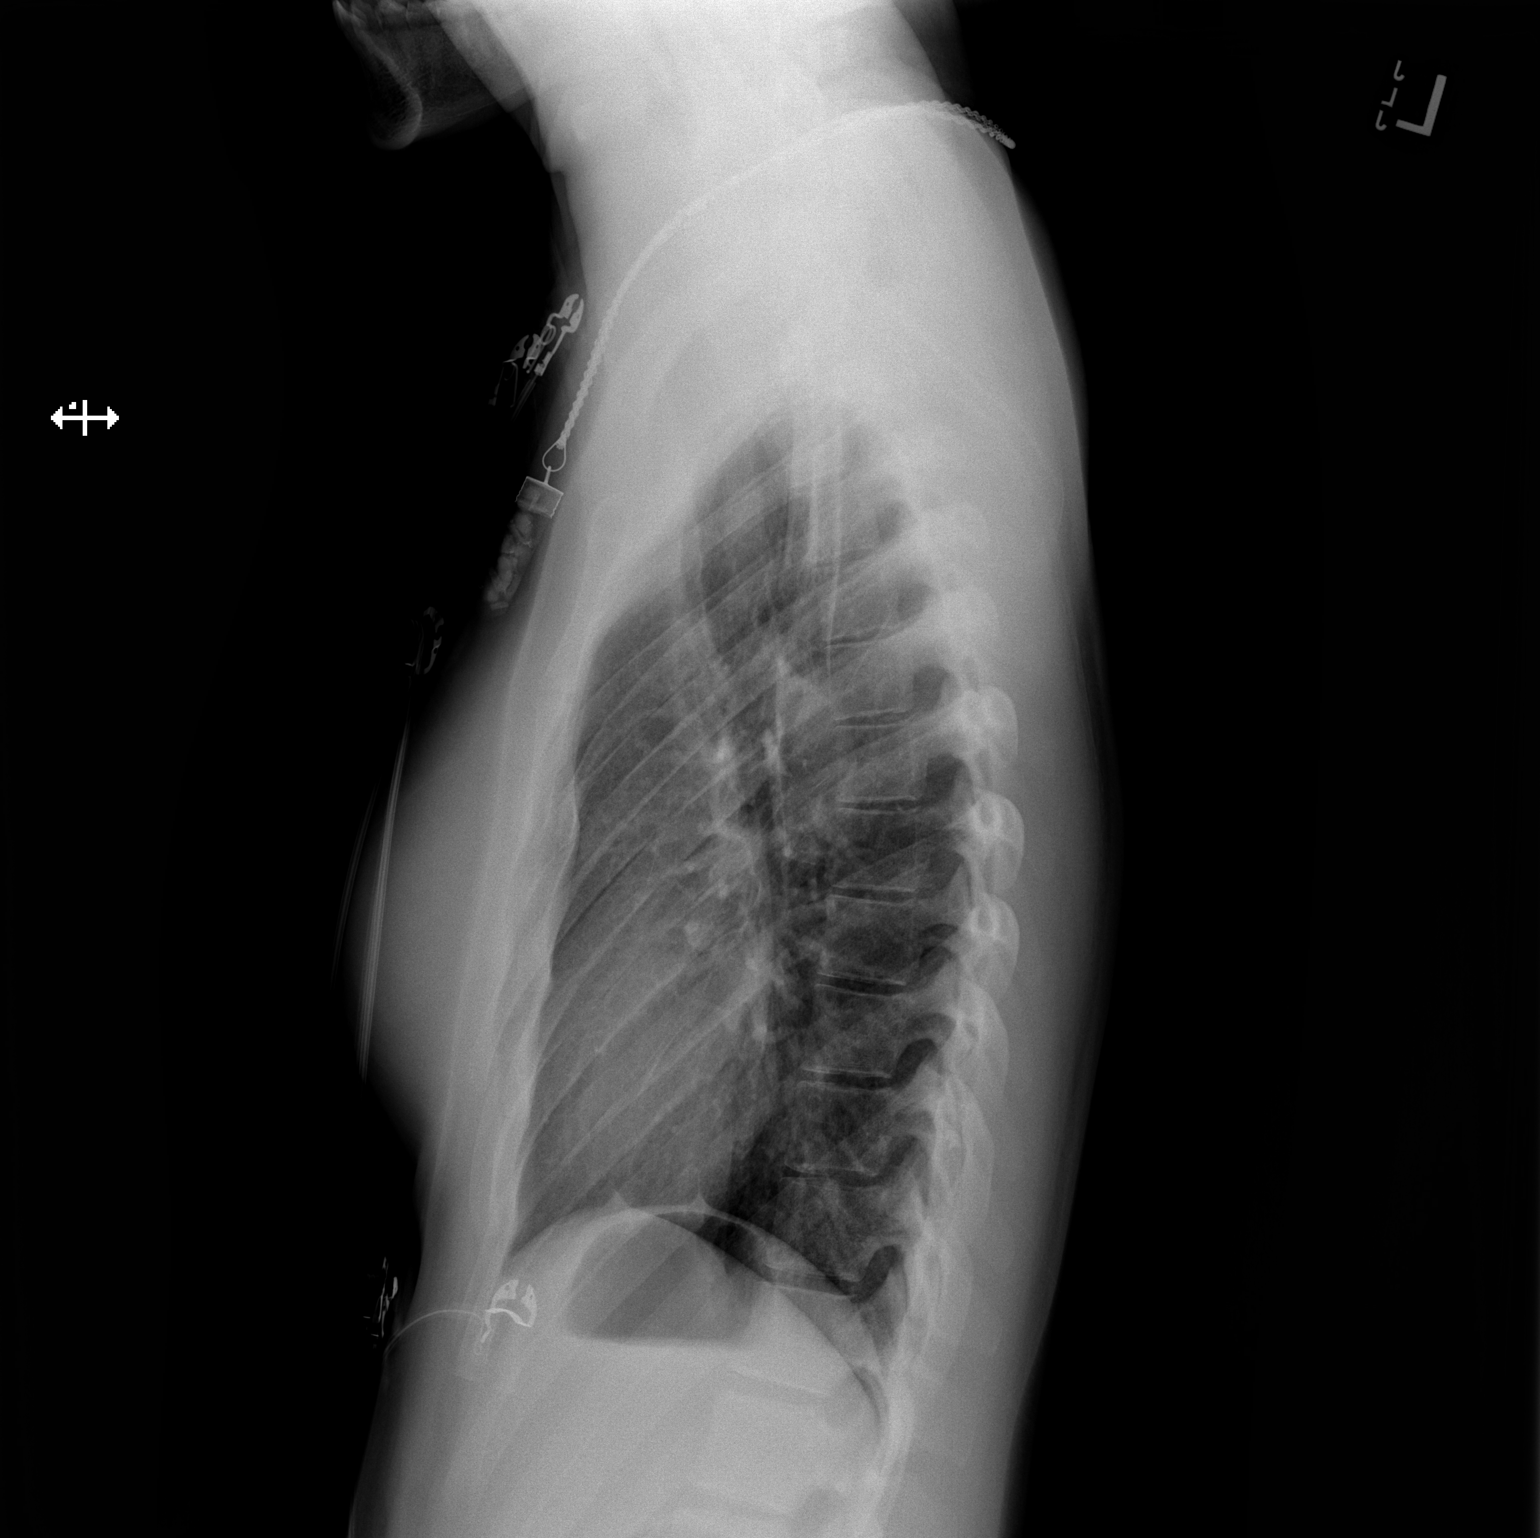

[2 of 2 positions shown; findings below may reference images not displayed]

FINDINGS: Lungs are clear.  No pleural effusion or pneumothorax.

The heart is normal in size.

Visualized osseous structures are within normal limits.
IMPRESSION: Normal chest radiographs.

## 2020-07-19 ENCOUNTER — Other Ambulatory Visit: Payer: Self-pay | Admitting: Diagnostic Neuroimaging

## 2020-10-05 ENCOUNTER — Other Ambulatory Visit: Payer: Self-pay | Admitting: Diagnostic Neuroimaging

## 2021-04-25 IMAGING — CR DG CHEST 2V
2 series · 2 of 2 positions shown · non-contrast
Comparison: November 06, 2018

CLINICAL DATA: Chest pain

EXAM:
CHEST - 2 VIEW

[w chest pa]
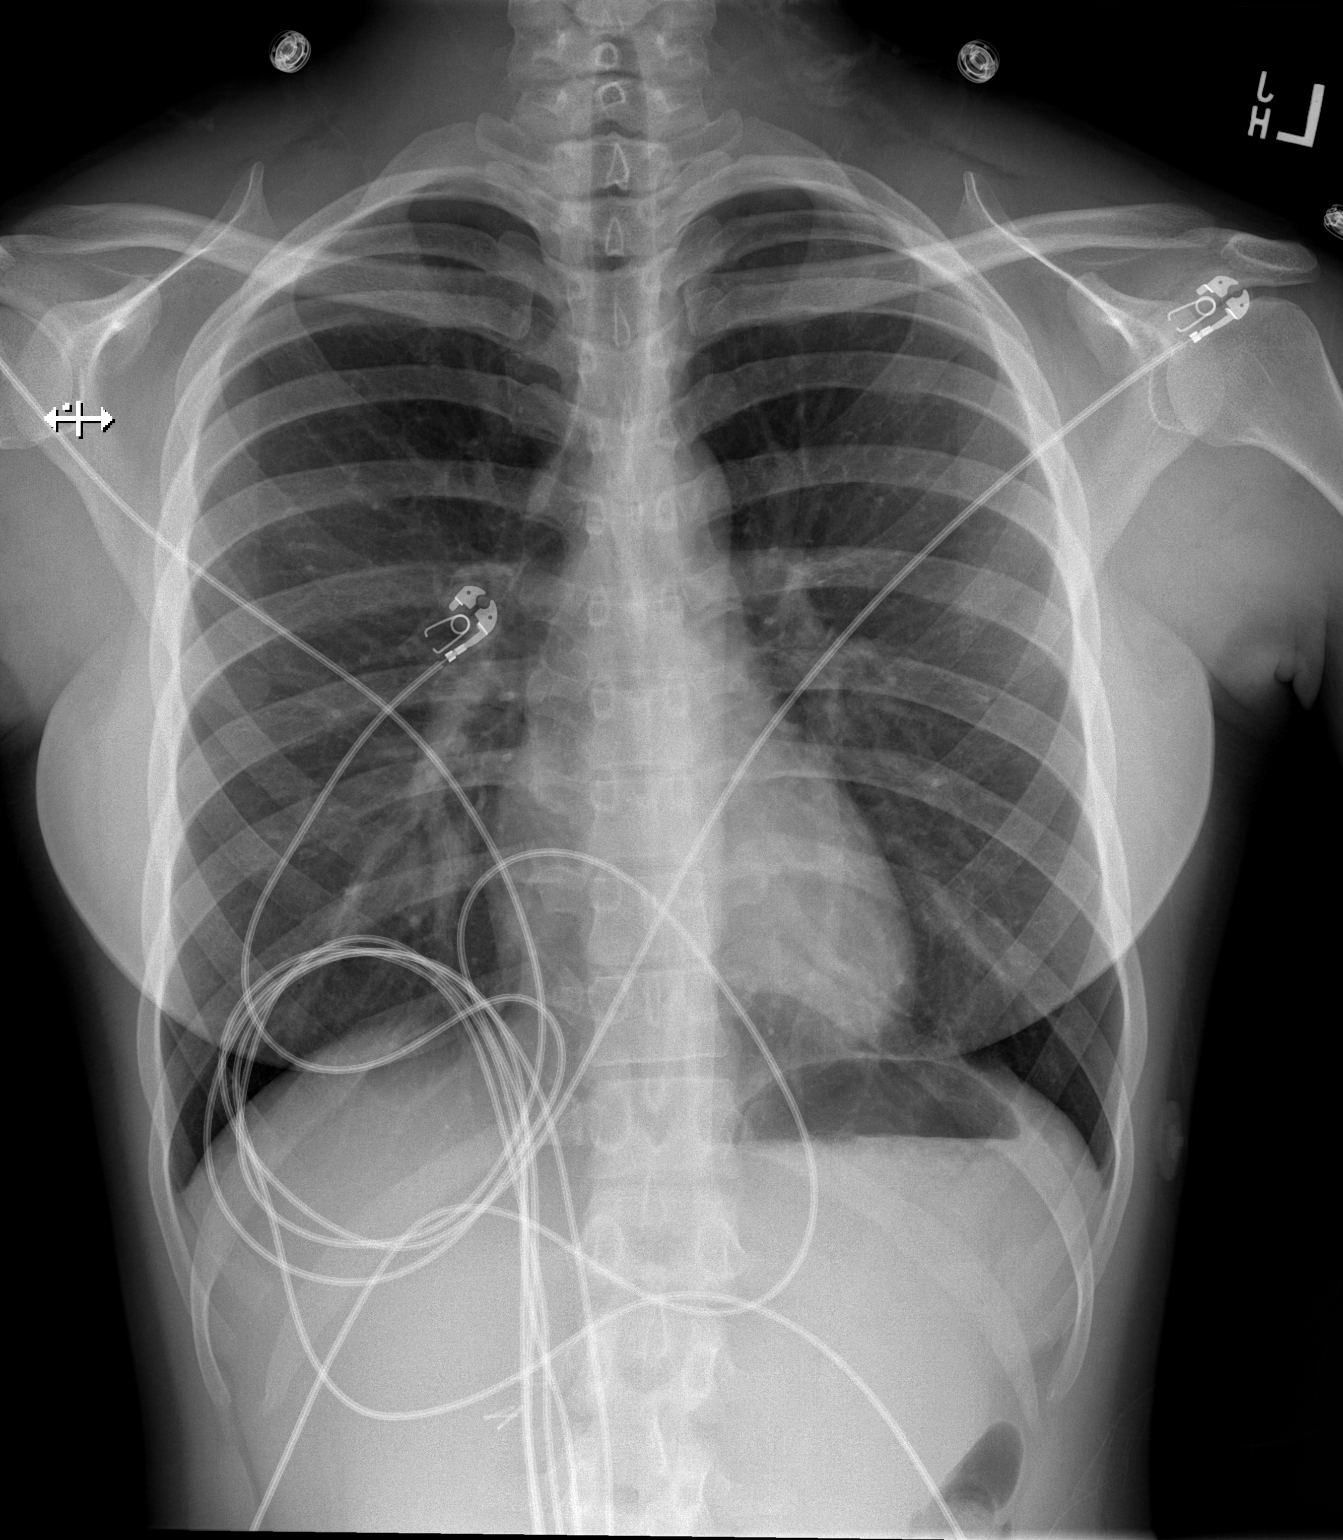

[w chest lat]
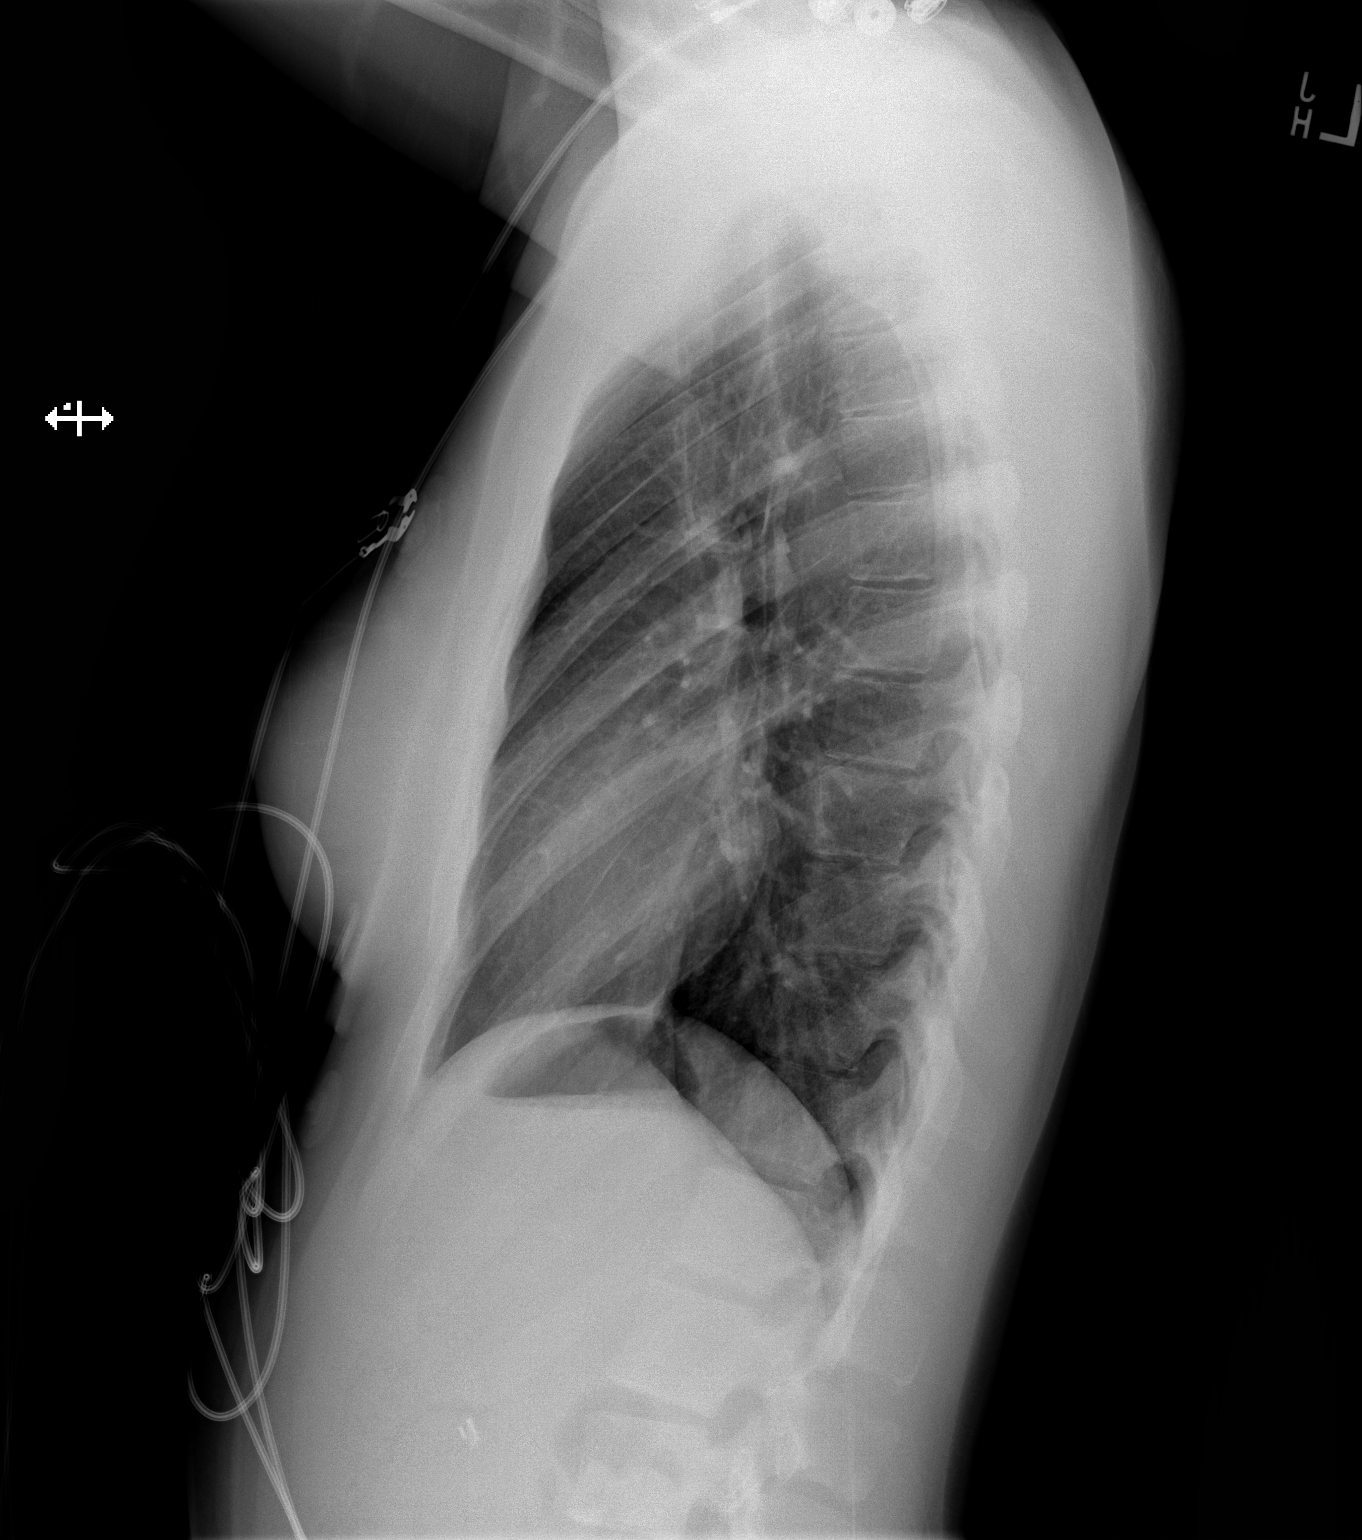

[2 of 2 positions shown; findings below may reference images not displayed]

FINDINGS: The heart size and mediastinal contours are within normal limits.
Both lungs are clear. The visualized skeletal structures are
unremarkable. Surgical clips in the right upper quadrant.
IMPRESSION: No acute cardiopulmonary process.

## 2022-01-21 ENCOUNTER — Encounter (HOSPITAL_BASED_OUTPATIENT_CLINIC_OR_DEPARTMENT_OTHER): Payer: Self-pay | Admitting: Urology

## 2022-01-21 ENCOUNTER — Other Ambulatory Visit: Payer: Self-pay

## 2022-01-21 ENCOUNTER — Emergency Department (HOSPITAL_BASED_OUTPATIENT_CLINIC_OR_DEPARTMENT_OTHER)
Admission: EM | Admit: 2022-01-21 | Discharge: 2022-01-21 | Disposition: A | Discharge status: Home / Self Care | Payer: Medicaid Other | Attending: Emergency Medicine | Admitting: Emergency Medicine

## 2022-01-21 DIAGNOSIS — J45909 Unspecified asthma, uncomplicated: Secondary | ICD-10-CM | POA: Diagnosis not present

## 2022-01-21 DIAGNOSIS — Z7952 Long term (current) use of systemic steroids: Secondary | ICD-10-CM | POA: Diagnosis not present

## 2022-01-21 DIAGNOSIS — R220 Localized swelling, mass and lump, head: Secondary | ICD-10-CM | POA: Diagnosis present

## 2022-01-21 DIAGNOSIS — Z79899 Other long term (current) drug therapy: Secondary | ICD-10-CM | POA: Insufficient documentation

## 2022-01-21 DIAGNOSIS — Z91040 Latex allergy status: Secondary | ICD-10-CM | POA: Insufficient documentation

## 2022-01-21 DIAGNOSIS — R21 Rash and other nonspecific skin eruption: Secondary | ICD-10-CM | POA: Diagnosis not present

## 2022-01-21 DIAGNOSIS — T7840XA Allergy, unspecified, initial encounter: Secondary | ICD-10-CM

## 2022-01-21 MED ORDER — FAMOTIDINE 20 MG PO TABS
20.0000 mg | ORAL_TABLET | Freq: Once | ORAL | Status: AC
  Administered 2022-01-21: 20 mg via ORAL
  Filled 2022-01-21: qty 1

## 2022-01-21 MED ORDER — PREDNISONE 10 MG PO TABS
20.0000 mg | ORAL_TABLET | Freq: Every day | ORAL | 0 refills | Status: AC
Start: 2022-01-21 — End: ?

## 2022-01-21 MED ORDER — ONDANSETRON 4 MG PO TBDP
8.0000 mg | ORAL_TABLET | Freq: Once | ORAL | Status: AC
  Administered 2022-01-21: 8 mg via ORAL
  Filled 2022-01-21: qty 2

## 2022-01-21 MED ORDER — DEXAMETHASONE SODIUM PHOSPHATE 10 MG/ML IJ SOLN
10.0000 mg | Freq: Once | INTRAMUSCULAR | Status: AC
  Administered 2022-01-21: 10 mg via INTRAMUSCULAR
  Filled 2022-01-21: qty 1

## 2022-01-21 MED ORDER — DIPHENHYDRAMINE HCL 25 MG PO CAPS
25.0000 mg | ORAL_CAPSULE | Freq: Once | ORAL | Status: AC
  Administered 2022-01-21: 25 mg via ORAL
  Filled 2022-01-21: qty 1

## 2022-01-21 NOTE — Discharge Instructions (Addendum)
The Decadron shot given you in the ED should stay in your system for the next 3 days.  If you are still having swelling to the lip or rash start taking the prednisone Dosepak 3 days from now.  Take 40 mg daily for 5 days.  Starting today you can take Benadryl or Claritin for the itching.  If symptoms worsen return back to the ED for additional evaluation.  Follow-up with the allergy clinic in [facility] for additional work-up.

## 2022-01-21 NOTE — ED Triage Notes (Signed)
Rash to chin that started at 1200 when waking up , lower lip swelling and numbness  States possible new allergy to seaweed in sushi but has not had any

## 2022-01-21 NOTE — ED Provider Notes (Signed)
[facility] EMERGENCY DEPT Provider Note   CSN: [id number] Arrival date & time: 01/21/22  1339     History  Chief Complaint  Patient presents with   Facial Swelling    Rachel Farrell is a 26 y.o. female.  HPI  Patient with medical history notable for asthma and eczema presents due to possible allergic reaction.  States she went to bed last night and woke up this morning and looked in the mirror noticing her lower lip was swollen.  Her chin felt itchy and looked broken out.  Patient has a history of 1 previous allergic reaction to sushi prior.  Has not taken anything for facial swelling yet.  Denies any chest pain, shortness of breath, nausea, vomiting, abdominal pain, difficulty swallowing itching in the throat.  She denies any known triggers.  She did change her laundry detergent recently but has used it previously without any reaction.  Home Medications Prior to Admission medications   Medication Sig Start Date End Date Taking? Authorizing Provider  acetaminophen (TYLENOL) 500 MG tablet Take 500-1,000 mg by mouth every 6 (six) hours as needed for moderate pain.    [provider]  Cranberry 450 MG TABS Take 1 tablet by mouth daily as needed (uti prevention).    [provider]  FLOVENT HFA 110 MCG/ACT inhaler Inhale 1 puff into the lungs 2 (two) times daily.  09/23/18   [provider]  Fremanezumab-vfrm (AJOVY) 225 MG/1.5ML SOAJ Inject 225 mg into the skin every 30 (thirty) days. 05/29/19   Penumalli, Glenford Bayley, MD  meclizine (ANTIVERT) 12.5 MG tablet Take 1 tablet (12.5 mg total) by mouth 3 (three) times daily as needed for dizziness. 09/03/19   McDonald, Mia A, PA-C  methocarbamol (ROBAXIN-750) 750 MG tablet Take 1 tablet (750 mg total) by mouth 4 (four) times daily. 09/09/19   Lorre Nick, MD  montelukast (SINGULAIR) 10 MG tablet Take 1 tablet (10 mg total) by mouth at bedtime. 09/02/18   Elvina Sidle, MD  Norethindrone-Ethinyl  Estradiol-Fe Biphas (LO LOESTRIN FE) 1 MG-10 MCG / 10 MCG tablet Take 1 tablet by mouth daily.  03/10/16   [provider]  ORILISSA 150 MG TABS Take 150 mg by mouth daily.  08/05/19   [provider]  predniSONE (DELTASONE) 50 MG tablet 1 p.o. daily x5 09/09/19   Lorre Nick, MD  PROAIR HFA 108 [phone] Base) MCG/ACT inhaler Inhale 1-2 puffs into the lungs every 6 (six) hours as needed for wheezing or shortness of breath.  09/23/18   [provider]  rizatriptan (MAXALT) 10 MG tablet As needed for headache; may repeat in 2 hours if needed; max 2 per day or 8 per month Patient taking differently: Take 10 mg by mouth as needed for migraine. As needed for headache; may repeat in 2 hours if needed; max 2 per day or 8 per month 02/03/19   Penumalli, Vikram R, MD  sertraline (ZOLOFT) 50 MG tablet Take 50 mg by mouth daily.    [provider]  topiramate (TOPAMAX) 50 MG tablet Take 1 tablet (50 mg total) by mouth 2 (two) times daily. Patient not taking: Reported on 09/02/2019 02/03/19 09/03/19  Suanne Marker, MD      Allergies    Molds & smuts, Penicillins, Topiramate, Honey, and Latex    Review of Systems   Review of Systems  Physical Exam Updated Vital Signs BP (!) 127/104 (BP Location: Left Arm)    Pulse 99    Temp 98.9  F (37.2 C) (Oral)    Resp 18    Ht 5\' 4"  (1.626 m)    Wt 61 kg    SpO2 98%    BMI 23.08 kg/m  Physical Exam Vitals and nursing note reviewed. Exam conducted with a chaperone present.  Constitutional:      Appearance: Normal appearance.  HENT:     Head: Normocephalic and atraumatic.     Mouth/Throat:     Mouth: Mucous membranes are moist.     Comments: Lower lip edematous, worse on the right side.  No pharyngeal edema Eyes:     General: No scleral icterus.       Right eye: No discharge.        Left eye: No discharge.     Extraocular Movements: Extraocular movements intact.     Pupils: Pupils are equal, round, and reactive to light.   Cardiovascular:     Rate and Rhythm: Normal rate and regular rhythm.     Pulses: Normal pulses.     Heart sounds: Normal heart sounds. No murmur heard.   No friction rub. No gallop.  Pulmonary:     Effort: Pulmonary effort is normal. No respiratory distress.     Breath sounds: Normal breath sounds. No stridor.  Abdominal:     General: Abdomen is flat. Bowel sounds are normal. There is no distension.     Palpations: Abdomen is soft.     Tenderness: There is no abdominal tenderness.  Skin:    General: Skin is warm and dry.     Coloration: Skin is not jaundiced.     Findings: Rash present.     Comments: Rash to chin, no hives noted to the torso or chest or extremities  Neurological:     Mental Status: She is alert. Mental status is at baseline.     Coordination: Coordination normal.    ED Results / Procedures / Treatments   Labs (all labs ordered are listed, but only abnormal results are displayed) Labs Reviewed - No data to display  EKG None  Radiology No results found.  Procedures Procedures    Medications Ordered in ED Medications  dexamethasone (DECADRON) injection 10 mg (10 mg Intramuscular Given 01/21/22 1412)  famotidine (PEPCID) tablet 20 mg (20 mg Oral Given 01/21/22 1411)  diphenhydrAMINE (BENADRYL) capsule 25 mg (25 mg Oral Given 01/21/22 1411)  ondansetron (ZOFRAN-ODT) disintegrating tablet 8 mg (8 mg Oral Given 01/21/22 1411)    ED Course/ Medical Decision Making/ A&P                           Medical Decision Making Risk OTC drugs. Prescription drug management. Decision regarding hospitalization.   26 year old presenting with lower lip swelling and rash.  Differential diagnosis includes but is not limited to anaphylaxis, angioedema, allergic reaction  Patient does not have history of anaphylaxis.  No use of ACE.  She does not have multisystem involvement that would be suggestive of anaphylaxis.  She does have a rash on her chin but it does not appear  to look like hives.  There is no dermatographia, not involving the torso, chest or extremities.  Although I did consider epinephrine do not feel warranted given does not appear to be in anaphylaxis.  Overall, patient is very well-appearing.  Symptoms have been ongoing for greater than 5 hours without deterioration making anaphylaxis or shock unlikely.  Patient given Decadron, Pepcid and Benadryl.  She reports improvement, no change  or worsening of symptoms.  Consider additional observation but ultimately feel patient is appropriate for discharge.  Will give prednisone Dosepak and refer to allergy clinic.        Final Clinical Impression(s) / ED Diagnoses Final diagnoses:  None    Rx / DC Orders ED Discharge Orders     None         Sherrill Raring, Hershal Coria 01/21/22 1501    Dorie Rank, MD 01/23/22 [phone]
